# Patient Record
Sex: Female | Born: 1975 | Race: White | Hispanic: No | Marital: Single | Smoking: Never smoker
Health system: Southern US, Community
[De-identification: ages and names within clinical notes are randomized; demographics above are authoritative.]

## PROBLEM LIST (undated history)

## (undated) DIAGNOSIS — Z8481 Family history of carrier of genetic disease: Secondary | ICD-10-CM

## (undated) DIAGNOSIS — Z803 Family history of malignant neoplasm of breast: Secondary | ICD-10-CM

## (undated) DIAGNOSIS — Z8042 Family history of malignant neoplasm of prostate: Secondary | ICD-10-CM

## (undated) HISTORY — DX: Family history of carrier of genetic disease: Z84.81

## (undated) HISTORY — PX: BREAST CYST EXCISION: SHX579

## (undated) HISTORY — DX: Family history of malignant neoplasm of prostate: Z80.42

## (undated) HISTORY — DX: Family history of malignant neoplasm of breast: Z80.3

---

## 1998-12-07 ENCOUNTER — Encounter: Admission: RE | Admit: 1998-12-07 | Discharge: 1998-12-07 | Payer: Self-pay | Admitting: Family Medicine

## 1998-12-07 ENCOUNTER — Encounter: Payer: Self-pay | Admitting: Family Medicine

## 2004-01-02 ENCOUNTER — Ambulatory Visit: Payer: Self-pay | Admitting: Family Medicine

## 2004-06-07 ENCOUNTER — Ambulatory Visit: Payer: Self-pay | Admitting: Family Medicine

## 2004-09-20 ENCOUNTER — Ambulatory Visit: Payer: Self-pay | Admitting: Family Medicine

## 2004-10-08 ENCOUNTER — Ambulatory Visit: Payer: Self-pay | Admitting: Internal Medicine

## 2004-10-10 ENCOUNTER — Encounter: Admission: RE | Admit: 2004-10-10 | Discharge: 2005-01-08 | Payer: Self-pay | Admitting: Family Medicine

## 2005-03-24 ENCOUNTER — Ambulatory Visit: Payer: Self-pay | Admitting: Family Medicine

## 2005-04-01 ENCOUNTER — Other Ambulatory Visit: Admission: RE | Admit: 2005-04-01 | Discharge: 2005-04-01 | Payer: Self-pay | Admitting: Obstetrics & Gynecology

## 2006-02-20 ENCOUNTER — Ambulatory Visit: Payer: Self-pay | Admitting: Family Medicine

## 2006-06-19 ENCOUNTER — Other Ambulatory Visit: Admission: RE | Admit: 2006-06-19 | Discharge: 2006-06-19 | Payer: Self-pay | Admitting: Obstetrics and Gynecology

## 2006-07-08 ENCOUNTER — Ambulatory Visit: Payer: Self-pay | Admitting: Internal Medicine

## 2006-07-10 DIAGNOSIS — S83509A Sprain of unspecified cruciate ligament of unspecified knee, initial encounter: Secondary | ICD-10-CM | POA: Insufficient documentation

## 2006-07-10 DIAGNOSIS — Z9189 Other specified personal risk factors, not elsewhere classified: Secondary | ICD-10-CM | POA: Insufficient documentation

## 2006-07-20 ENCOUNTER — Ambulatory Visit: Payer: Self-pay | Admitting: Family Medicine

## 2006-07-20 DIAGNOSIS — B370 Candidal stomatitis: Secondary | ICD-10-CM | POA: Insufficient documentation

## 2006-07-29 ENCOUNTER — Telehealth (INDEPENDENT_AMBULATORY_CARE_PROVIDER_SITE_OTHER): Payer: Self-pay | Admitting: *Deleted

## 2006-08-05 ENCOUNTER — Ambulatory Visit: Payer: Self-pay | Admitting: Family Medicine

## 2006-08-06 ENCOUNTER — Encounter: Payer: Self-pay | Admitting: Family Medicine

## 2006-08-09 LAB — CONVERTED CEMR LAB

## 2006-08-10 ENCOUNTER — Telehealth (INDEPENDENT_AMBULATORY_CARE_PROVIDER_SITE_OTHER): Payer: Self-pay | Admitting: *Deleted

## 2006-08-10 LAB — CONVERTED CEMR LAB
BUN: 10 mg/dL (ref 6–23)
Basophils Absolute: 0 10*3/uL (ref 0.0–0.1)
Basophils Relative: 0 % (ref 0.0–1.0)
CO2: 30 meq/L (ref 19–32)
Calcium: 9.4 mg/dL (ref 8.4–10.5)
Chloride: 106 meq/L (ref 96–112)
Creatinine, Ser: 0.7 mg/dL (ref 0.4–1.2)
Eosinophils Absolute: 0 10*3/uL (ref 0.0–0.6)
Eosinophils Relative: 0.9 % (ref 0.0–5.0)
GFR calc Af Amer: 126 mL/min
GFR calc non Af Amer: 104 mL/min
Glucose, Bld: 93 mg/dL (ref 70–99)
HCT: 40 % (ref 36.0–46.0)
Hemoglobin: 13.8 g/dL (ref 12.0–15.0)
Lymphocytes Relative: 39.1 % (ref 12.0–46.0)
MCHC: 34.5 g/dL (ref 30.0–36.0)
MCV: 96.5 fL (ref 78.0–100.0)
Monocytes Absolute: 0.4 10*3/uL (ref 0.2–0.7)
Monocytes Relative: 7 % (ref 3.0–11.0)
Neutro Abs: 2.9 10*3/uL (ref 1.4–7.7)
Neutrophils Relative %: 53 % (ref 43.0–77.0)
Platelets: 179 10*3/uL (ref 150–400)
Potassium: 4.3 meq/L (ref 3.5–5.1)
RBC: 4.14 M/uL (ref 3.87–5.11)
RDW: 12 % (ref 11.5–14.6)
Sodium: 140 meq/L (ref 135–145)
WBC: 5.5 10*3/uL (ref 4.5–10.5)

## 2006-08-18 ENCOUNTER — Encounter: Payer: Self-pay | Admitting: Family Medicine

## 2007-01-19 ENCOUNTER — Ambulatory Visit: Payer: Self-pay | Admitting: Family Medicine

## 2007-01-22 ENCOUNTER — Ambulatory Visit: Payer: Self-pay | Admitting: Family Medicine

## 2007-11-05 ENCOUNTER — Other Ambulatory Visit: Admission: RE | Admit: 2007-11-05 | Discharge: 2007-11-05 | Payer: Self-pay | Admitting: Obstetrics and Gynecology

## 2008-01-20 ENCOUNTER — Ambulatory Visit: Payer: Self-pay | Admitting: Internal Medicine

## 2008-01-20 DIAGNOSIS — K219 Gastro-esophageal reflux disease without esophagitis: Secondary | ICD-10-CM | POA: Insufficient documentation

## 2008-01-20 DIAGNOSIS — L259 Unspecified contact dermatitis, unspecified cause: Secondary | ICD-10-CM | POA: Insufficient documentation

## 2008-01-20 DIAGNOSIS — R030 Elevated blood-pressure reading, without diagnosis of hypertension: Secondary | ICD-10-CM | POA: Insufficient documentation

## 2008-02-17 ENCOUNTER — Encounter: Payer: Self-pay | Admitting: Internal Medicine

## 2008-02-17 LAB — CONVERTED CEMR LAB
ALT: 17 units/L (ref 0–35)
AST: 20 units/L (ref 0–37)
Albumin: 4 g/dL (ref 3.5–5.2)
Alkaline Phosphatase: 50 units/L (ref 39–117)
BUN: 11 mg/dL (ref 6–23)
Basophils Absolute: 0 10*3/uL (ref 0.0–0.1)
Basophils Relative: 0.5 % (ref 0.0–3.0)
Bilirubin Urine: NEGATIVE
Bilirubin, Direct: 0.1 mg/dL (ref 0.0–0.3)
CO2: 28 meq/L (ref 19–32)
Calcium: 9.2 mg/dL (ref 8.4–10.5)
Chloride: 105 meq/L (ref 96–112)
Cholesterol: 175 mg/dL (ref 0–200)
Creatinine, Ser: 0.7 mg/dL (ref 0.4–1.2)
Crystals: NEGATIVE
Eosinophils Absolute: 0.1 10*3/uL (ref 0.0–0.7)
Eosinophils Relative: 1.2 % (ref 0.0–5.0)
GFR calc Af Amer: 125 mL/min
GFR calc non Af Amer: 103 mL/min
Glucose, Bld: 98 mg/dL (ref 70–99)
HCT: 42.1 % (ref 36.0–46.0)
HDL: 77.2 mg/dL (ref >39.0)
Hemoglobin: 14.6 g/dL (ref 12.0–15.0)
Ketones, ur: NEGATIVE mg/dL
LDL Cholesterol: 89 mg/dL (ref 0–99)
Lymphocytes Relative: 34.9 % (ref 12.0–46.0)
MCHC: 34.7 g/dL (ref 30.0–36.0)
MCV: 96.3 fL (ref 78.0–100.0)
Monocytes Absolute: 0.3 10*3/uL (ref 0.1–1.0)
Monocytes Relative: 7 % (ref 3.0–12.0)
Mucus, UA: NEGATIVE
Neutro Abs: 2.5 10*3/uL (ref 1.4–7.7)
Neutrophils Relative %: 56.4 % (ref 43.0–77.0)
Nitrite: NEGATIVE
Platelets: 189 10*3/uL (ref 150–400)
Potassium: 4.6 meq/L (ref 3.5–5.1)
RBC: 4.37 M/uL (ref 3.87–5.11)
RDW: 11.6 % (ref 11.5–14.6)
Sodium: 140 meq/L (ref 135–145)
Specific Gravity, Urine: 1.015 (ref 1.000–1.03)
Total Bilirubin: 0.7 mg/dL (ref 0.3–1.2)
Total CHOL/HDL Ratio: 2.3
Total Protein: 6.1 g/dL (ref 6.0–8.3)
Triglycerides: 42 mg/dL (ref 0–149)
Urine Glucose: NEGATIVE mg/dL
Urobilinogen, UA: 0.2 (ref 0.0–1.0)
VLDL: 8 mg/dL (ref 0–40)
WBC: 4.5 10*3/uL (ref 4.5–10.5)
pH: 7.5 (ref 5.0–8.0)

## 2008-03-23 ENCOUNTER — Ambulatory Visit: Payer: Self-pay | Admitting: Internal Medicine

## 2008-03-23 LAB — CONVERTED CEMR LAB
ALT: 17 units/L (ref 0–35)
AST: 20 units/L (ref 0–37)
Albumin: 4 g/dL (ref 3.5–5.2)
Alkaline Phosphatase: 50 units/L (ref 39–117)
BUN: 11 mg/dL (ref 6–23)
Basophils Absolute: 0 10*3/uL (ref 0.0–0.1)
Basophils Relative: 0.5 % (ref 0.0–3.0)
Bilirubin Urine: NEGATIVE
Bilirubin, Direct: 0.1 mg/dL (ref 0.0–0.3)
CO2: 28 meq/L (ref 19–32)
Calcium: 9.2 mg/dL (ref 8.4–10.5)
Chloride: 105 meq/L (ref 96–112)
Cholesterol: 175 mg/dL (ref 0–200)
Creatinine, Ser: 0.7 mg/dL (ref 0.4–1.2)
Crystals: NEGATIVE
Eosinophils Absolute: 0.1 10*3/uL (ref 0.0–0.7)
Eosinophils Relative: 1.2 % (ref 0.0–5.0)
GFR calc Af Amer: 125 mL/min
GFR calc non Af Amer: 103 mL/min
Glucose, Bld: 98 mg/dL (ref 70–99)
HCT: 42.1 % (ref 36.0–46.0)
HDL: 77.2 mg/dL (ref >39.0)
Hemoglobin: 14.6 g/dL (ref 12.0–15.0)
Ketones, ur: NEGATIVE mg/dL
LDL Cholesterol: 89 mg/dL (ref 0–99)
Lymphocytes Relative: 34.9 % (ref 12.0–46.0)
MCHC: 34.7 g/dL (ref 30.0–36.0)
MCV: 96.3 fL (ref 78.0–100.0)
Monocytes Absolute: 0.3 10*3/uL (ref 0.1–1.0)
Monocytes Relative: 7 % (ref 3.0–12.0)
Mucus, UA: NEGATIVE
Neutro Abs: 2.5 10*3/uL (ref 1.4–7.7)
Neutrophils Relative %: 56.4 % (ref 43.0–77.0)
Nitrite: NEGATIVE
Platelets: 189 10*3/uL (ref 150–400)
Potassium: 4.6 meq/L (ref 3.5–5.1)
RBC: 4.37 M/uL (ref 3.87–5.11)
RDW: 11.6 % (ref 11.5–14.6)
Sodium: 140 meq/L (ref 135–145)
Specific Gravity, Urine: 1.015 (ref 1.000–1.03)
Total Bilirubin: 0.7 mg/dL (ref 0.3–1.2)
Total CHOL/HDL Ratio: 2.3
Total Protein: 6.1 g/dL (ref 6.0–8.3)
Triglycerides: 42 mg/dL (ref 0–149)
Urine Glucose: NEGATIVE mg/dL
Urobilinogen, UA: 0.2 (ref 0.0–1.0)
VLDL: 8 mg/dL (ref 0–40)
WBC: 4.5 10*3/uL (ref 4.5–10.5)
pH: 7.5 (ref 5.0–8.0)

## 2008-03-28 ENCOUNTER — Encounter: Payer: Self-pay | Admitting: Internal Medicine

## 2009-09-07 ENCOUNTER — Encounter: Admission: RE | Admit: 2009-09-07 | Discharge: 2009-09-07 | Payer: Self-pay | Admitting: Specialist

## 2009-09-13 ENCOUNTER — Encounter: Admission: RE | Admit: 2009-09-13 | Discharge: 2009-09-13 | Payer: Self-pay | Admitting: Sports Medicine

## 2010-02-13 ENCOUNTER — Encounter
Admission: RE | Admit: 2010-02-13 | Discharge: 2010-02-13 | Payer: Self-pay | Source: Home / Self Care | Attending: Neurology | Admitting: Neurology

## 2011-04-03 ENCOUNTER — Ambulatory Visit: Payer: Self-pay

## 2011-04-03 ENCOUNTER — Ambulatory Visit: Payer: PRIVATE HEALTH INSURANCE | Admitting: Family Medicine

## 2011-04-03 DIAGNOSIS — R509 Fever, unspecified: Secondary | ICD-10-CM

## 2011-04-03 DIAGNOSIS — R05 Cough: Secondary | ICD-10-CM

## 2011-04-03 DIAGNOSIS — R059 Cough, unspecified: Secondary | ICD-10-CM

## 2011-04-03 LAB — POCT CBC
Lymph, poc: 1 (ref 0.6–3.4)
MCHC: 32.6 g/dL (ref 31.8–35.4)
MID (cbc): 0.4 (ref 0–0.9)
MPV: 10.3 fL (ref 0–99.8)
POC Granulocyte: 2.8 (ref 2–6.9)
POC LYMPH PERCENT: 24.6 %L (ref 10–50)
POC MID %: 9.3 %M (ref 0–12)
Platelet Count, POC: 155 10*3/uL (ref 142–424)
RDW, POC: 12.8 %

## 2011-04-03 NOTE — Patient Instructions (Signed)
Mucinex, saline nasal spray, otc decongestants as needed.  Return to the clinic or go to the nearest emergency room if any of your symptoms worsen or new symptoms occur.   Influenza, Adult Influenza ("the flu") is a viral infection of the respiratory tract. It causes chills, fever, cough, headache, body aches, and sore throat. Influenza in general will make you feel sicker than when you have a common cold. Symptoms of the illness typically last a few days. Cough and fatigue may continue for as long as 7 to 10 days. Influenza is highly contagious. It spreads easily to others in the droplets from coughs and sneezes. People frequently become infected by touching something that was recently contaminated with the virus and then touch their mouth, nose or eyes. This infection is caused by a virus. Symptoms will not be reduced or improved by taking an antibiotic. Antibiotics are medications that kill bacteria, not viruses. DIAGNOSIS  Diagnosis of influenza is often made based on the history and physical examination as well as the presence of influenza reports occurring in your community. Testing can be done if the diagnosis is not certain. TREATMENT  Since influenza is caused by a virus, antibiotics are not helpful. Your caregiver may prescribe antiviral medicines to shorten the illness and lessen the severity. Your caregiver may also recommend influenza vaccination and/or antiviral medicines for your family members in order to prevent the spread of influenza to them. HOME CARE INSTRUCTIONS  DO NOT GIVE ASPIRIN TO PERSONS WITH INFLUENZA WHO ARE UNDER 68 YEARS OF AGE. This could lead to brain and liver damage (Reye's syndrome). Read the label on over-the-counter medicines.   Stay home from work or school if at all possible until most of your symptoms are gone.   Only take over-the-counter or prescription medicines for pain, discomfort, or fever as directed by your caregiver.   Use a cool mist humidifier  to increase air moisture. This will make breathing easier.   Rest until your temperature is nearly normal: 98.6 F (37 C). This usually takes 3 to 4 days. Be sure you get plenty of rest.   Drink at least eight, eight-ounce glasses of fluids per day. Fluids include water, juice, broth, gelatin, or lemonade.   Cover your mouth and nose when coughing or sneezing and wash your hands often to prevent the spread of this virus to other persons.  PREVENTION  Annual influenza vaccination (flu shots) is the best way to avoid getting influenza. An annual flu shot is now routinely recommended for all adults in the [place] SEEK MEDICAL CARE IF:   You develop shortness of breath while resting.   You have a deep cough with production of mucous or chest pain.   You develop nausea (feeling sick to your stomach), vomiting, or diarrhea.  SEEK IMMEDIATE MEDICAL CARE IF:   You have difficulty breathing, become short of breath, or your skin or nails turn bluish.   You develop severe neck pain or stiffness.   You develop a severe headache, facial pain, or earache.   You have a fever.   You develop nausea or vomiting that cannot be controlled.  Document Released: 02/01/2000 Document Revised: 10/16/2010 Document Reviewed: 12/06/2008 [facility] Patient Information 2012 [place], [place].

## 2011-04-03 NOTE — Progress Notes (Signed)
  Subjective:    Patient ID: Angelica Martinez, female    DOB: 1975/03/10, 36 y.o.   MRN: [id number]  HPI  Angelica Martinez is a 36 y.o. female 4 nights ago - f/c and body aches - temp 101  Stayed out of work x 1 day - had been better, went to work yesterday,  Chest and nasal congestion last night and today.  Temp 100.1 again. Tx: Advil and Advil congestion relief, airborne.  No flu vaccine this year.  Review of Systems  Constitutional: Positive for fever and chills.  HENT: Positive for congestion and rhinorrhea.   Respiratory: Positive for cough. Negative for shortness of breath and wheezing.   Gastrointestinal: Negative for abdominal pain.  Genitourinary: Negative for difficulty urinating.  Musculoskeletal: Positive for arthralgias.  Skin: Negative for rash.       Objective:   Physical Exam  Constitutional: She is oriented to person, place, and time. She appears well-developed and well-nourished.  HENT:  Head: Normocephalic and atraumatic.  Right Ear: External ear normal.  Left Ear: External ear normal.  Nose: Nose normal.  Mouth/Throat: Oropharynx is clear and moist. No oropharyngeal exudate.  Eyes: Conjunctivae and EOM are normal. Pupils are equal, round, and reactive to light.  Neck: Normal range of motion.  Cardiovascular: Normal rate, regular rhythm, normal heart sounds and intact distal pulses.   Pulmonary/Chest: Effort normal and breath sounds normal. No respiratory distress. She has no wheezes. She has no rales.  Neurological: She is alert and oriented to person, place, and time.  Skin: No rash noted.  Psychiatric: She has a normal mood and affect.   Results for orders placed in visit on 04/03/11  POCT INFLUENZA A/B      Component Value Range   Influenza A, POC Positive     Influenza B, POC      POCT CBC      Component Value Range   WBC 4.2 (*) 4.6 - 10.2 (K/uL)   Lymph, poc 1.0  0.6 - 3.4    POC LYMPH PERCENT 24.6  10 - 50 (%L)   MID (cbc) 0.4  0 - 0.9    POC MID % 9.3  0 - 12 (%M)   POC Granulocyte 2.8  2 - 6.9    Granulocyte percent 66.1  37 - 80 (%G)   RBC 4.75  4.04 - 5.48 (M/uL)   Hemoglobin 15.0  12.2 - 16.2 (g/dL)   HCT, POC 33.2  95.1 - 47.9 (%)   MCV 96.8  80 - 97 (fL)   MCH, POC 31.6 (*) 27 - 31.2 (pg)   MCHC 32.6  31.8 - 35.4 (g/dL)   RDW, POC 88.4     Platelet Count, POC 155  142 - 424 (K/uL)   MPV 10.3  0 - 99.8 (fL)         Assessment & Plan:   1. Influenza    2. Fever  POCT Influenza A/B, POCT CBC  3. Cough  POCT Influenza A/B   Influenza sx's now 3 days into sx's.   Discussed Tamiflu and timing, so deferred at this point.   Sx. Care  -- advil, tylenol, mucinex, lozenges and rtc precautions.

## 2017-04-15 DIAGNOSIS — J209 Acute bronchitis, unspecified: Secondary | ICD-10-CM | POA: Diagnosis not present

## 2017-04-16 ENCOUNTER — Ambulatory Visit (INDEPENDENT_AMBULATORY_CARE_PROVIDER_SITE_OTHER): Payer: BLUE CROSS/BLUE SHIELD | Admitting: Family Medicine

## 2017-04-16 ENCOUNTER — Encounter: Payer: Self-pay | Admitting: Family Medicine

## 2017-04-16 ENCOUNTER — Other Ambulatory Visit: Payer: Self-pay

## 2017-04-16 VITALS — BP 122/70 | HR 90 | Temp 97.7°F | Resp 16 | Ht 62.8 in | Wt 165.4 lb

## 2017-04-16 DIAGNOSIS — R05 Cough: Secondary | ICD-10-CM | POA: Diagnosis not present

## 2017-04-16 DIAGNOSIS — J069 Acute upper respiratory infection, unspecified: Secondary | ICD-10-CM | POA: Diagnosis not present

## 2017-04-16 DIAGNOSIS — J040 Acute laryngitis: Secondary | ICD-10-CM | POA: Diagnosis not present

## 2017-04-16 DIAGNOSIS — R059 Cough, unspecified: Secondary | ICD-10-CM

## 2017-04-16 MED ORDER — BENZONATATE 100 MG PO CAPS: 100.0000 mg | ORAL_CAPSULE | Freq: Three times a day (TID) | ORAL | 0 refills | Status: DC | PRN

## 2017-04-16 MED ORDER — HYDROCODONE-HOMATROPINE 5-1.5 MG/5ML PO SYRP: ORAL_SOLUTION | ORAL | 0 refills | Status: DC

## 2017-04-16 MED ORDER — AZITHROMYCIN 250 MG PO TABS: ORAL_TABLET | ORAL | 0 refills | Status: DC

## 2017-04-16 MED ORDER — FLUCONAZOLE 150 MG PO TABS: 150.0000 mg | ORAL_TABLET | Freq: Once | ORAL | 1 refills | Status: AC

## 2017-04-16 NOTE — Progress Notes (Signed)
Subjective:  By signing my name below, I, Angelica Martinez, attest that this documentation has been prepared under the direction and in the presence of Angelica Seat Asencion Partridge, MD.  Electronically Signed: Rosana Martinez, Medical Scribe 04/16/17 at 12:32 PM   Patient ID: Angelica Martinez, female    DOB: [birth year], 42 y.o.   MRN: [id number] Chief Complaint  Patient presents with  . Establish Care  . Nasal Congestion    with cough x 1 week     HPI Angelica Martinez is a 42 y.o. female who presents to Primary Care at [facility] to establish care. She has a Hx of GERD, eczema, and borderline high blood pressure previously. I last saw her in 2013 for influenza. She is here today with acute illness with nasal congestion and cough for 1 week.   She notes that she developed a weak sounding voice 1 week ago that worsened with symptoms of nasal congestion and dry cough. She tried Dayquil and Nyquil and her symptoms improved for 1 day and then 3 days ago her symptoms were much exacerbated. Her cough became worse, she notes it is not a deep cough though. She endorses contact with flu-like symptoms; she did have a flu shot this year. She denies antibiotic use but notes that she usually develops a yeast infection when she takes antibiotics. She denies fever, generalized myalgia, wheezing, SOB.     Patient Active Problem List   Diagnosis Date Noted  . GERD 01/20/2008  . ECZEMA 01/20/2008  . ELEVATED BLOOD PRESSURE WITHOUT DIAGNOSIS OF HYPERTENSION 01/20/2008  . CANDIDIASIS, MOUTH (THRUSH) 07/20/2006  . ACL TEAR 07/10/2006  . REDUCTION MAMMOPLASTY, HX OF 07/10/2006   History reviewed. No pertinent past medical history. History reviewed. No pertinent surgical history. Allergies  Allergen Reactions  . Tylenol [Acetaminophen] Palpitations  . Metronidazole    Prior to Admission medications   Medication Sig Start Date End Date Taking? Authorizing Provider  ibuprofen (ADVIL,MOTRIN) 100 MG chewable tablet Chew  400 mg by mouth every 8 (eight) hours as needed.   Yes [provider]   Social History   Socioeconomic History  . Marital status: Single    Spouse name: Not on file  . Number of children: Not on file  . Years of education: Not on file  . Highest education level: Not on file  Social Needs  . Financial resource strain: Not on file  . Food insecurity - worry: Not on file  . Food insecurity - inability: Not on file  . Transportation needs - medical: Not on file  . Transportation needs - non-medical: Not on file  Occupational History  . Not on file  Tobacco Use  . Smoking status: Never Smoker  . Smokeless tobacco: Never Used  Substance and Sexual Activity  . Alcohol use: Not on file  . Drug use: Not on file  . Sexual activity: Not on file  Other Topics Concern  . Not on file  Social History Narrative  . Not on file   Review of Systems  Constitutional: Negative for fever.       (+) hoarse voice  HENT: Positive for congestion (nasal).   Respiratory: Positive for cough (dry). Negative for shortness of breath and wheezing.   Musculoskeletal: Negative for myalgias.       Objective:   Physical Exam  Constitutional: She is oriented to person, place, and time. She appears well-developed and well-nourished. No distress.  HENT:  Head: Normocephalic and atraumatic.  Right Ear: Hearing, tympanic membrane,  external ear and ear canal normal.  Left Ear: Hearing, tympanic membrane, external ear and ear canal normal.  Nose: Nose normal. Right sinus exhibits no maxillary sinus tenderness and no frontal sinus tenderness. Left sinus exhibits no maxillary sinus tenderness and no frontal sinus tenderness.  Mouth/Throat: Oropharynx is clear and moist and mucous membranes are normal. No oropharyngeal exudate.  Minimal edema on turbinates  Eyes: Conjunctivae and EOM are normal. Pupils are equal, round, and reactive to light.  Cardiovascular: Normal rate, regular rhythm, normal heart  sounds and intact distal pulses.  No murmur heard. Pulmonary/Chest: Effort normal and breath sounds normal. No stridor. No respiratory distress. She has no wheezes. She has no rhonchi.  Neurological: She is alert and oriented to person, place, and time.  Skin: Skin is warm and dry. No rash noted.  Psychiatric: She has a normal mood and affect. Her behavior is normal.  Vitals reviewed.   Vitals:   04/16/17 1159  BP: 122/70  Pulse: 90  Resp: 16  Temp: 97.7 F (36.5 C)  TempSrc: Oral  SpO2: 99%  Weight: 165 lb 6.4 oz (75 kg)  Height: 5' 2.8" (1.595 m)      Assessment & Plan:    [name] is a 42 y.o. female Acute upper respiratory infection  Cough - Plan: benzonatate (TESSALON) 100 MG capsule, HYDROcodone-homatropine (HYCODAN) 5-1.5 MG/5ML syrup, azithromycin (ZITHROMAX) 250 MG tablet  Laryngitis  Initially suspected viral symptoms with possible secondary worsening recently versus worsening symptoms coming off of medication. Reassuring exam, reassuring vital signs.   -Continued symptomatic care discussed with Tessalon Perles, saline nasal spray if needed, fluids, relative rest, hydrocodone as cough syrup at night if needed or NyQuil. Do not combine.  -If not improving through the weekend, antibiotic Z-Pak provided for coverage of lower respiratory tract infection, unlikely needed at this time. Potential side effects discussed, history of candidal vaginitis with antibodies - Diflucan prescription provided if needed  -RTC precautions  Meds ordered this encounter  Medications  . benzonatate (TESSALON) 100 MG capsule    Sig: Take 1 capsule (100 mg total) by mouth 3 (three) times daily as needed for cough.    Dispense:  20 capsule    Refill:  0  . HYDROcodone-homatropine (HYCODAN) 5-1.5 MG/5ML syrup    Sig: 70m by mouth a bedtime as needed for cough.    Dispense:  120 mL    Refill:  0  . azithromycin (ZITHROMAX) 250 MG tablet    Sig: Take 2 pills by mouth on day 1, then  1 pill by mouth per day on days 2 through 5.    Dispense:  6 tablet    Refill:  0  . fluconazole (DIFLUCAN) 150 MG tablet    Sig: Take 1 tablet (150 mg total) by mouth once for 1 dose. Repeat in 1 week if needed.    Dispense:  1 tablet    Refill:  1   Patient Instructions   Current symptoms may still be due to a virus. Try Tessalon Perles 3 times per day as needed for cough, hydrocodone cough syrup at bedtime if needed. Do not combine hydrocodone syrup and NyQuil. Make sure you stay hydrated, voice rest as much is possible for laryngitis symptoms, and can try over-the-counter saline nasal spray for nasal congestion. If you are not starting to improve through the weekend, can fill antibiotic as discussed, then Diflucan if needed.  Let me know if there are questions. Follow-up at some point for physical and  we can check to see if there are other screening tests needed.   Laryngitis Laryngitis is inflammation of your vocal cords. This causes hoarseness, coughing, loss of voice, sore throat, or a dry throat. Your vocal cords are two bands of muscles that are found in your throat. When you speak, these cords come together and vibrate. These vibrations come out through your mouth as sound. When your vocal cords are inflamed, your voice sounds different. Laryngitis can be temporary (acute) or long-term (chronic). Most cases of acute laryngitis improve with time. Chronic laryngitis is laryngitis that lasts for more than three weeks. What are the causes? Acute laryngitis may be caused by:  A viral infection.  Lots of talking, yelling, or singing. This is also called vocal strain.  Bacterial infections.  Chronic laryngitis may be caused by:  Vocal strain.  Injury to your vocal cords.  Acid reflux (gastroesophageal reflux disease or GERD).  Allergies.  Sinus infection.  Smoking.  Alcohol abuse.  Breathing in chemicals or dust.  Growths on the vocal cords.  What increases the  risk? Risk factors for laryngitis include:  Smoking.  Alcohol abuse.  Having allergies.  What are the signs or symptoms? Symptoms of laryngitis may include:  Low, hoarse voice.  Loss of voice.  Dry cough.  Sore throat.  Stuffy nose.  How is this diagnosed? Laryngitis may be diagnosed by:  Physical exam.  Throat culture.  Blood test.  Laryngoscopy. This procedure allows your health care provider to look at your vocal cords with a mirror or viewing tube.  How is this treated? Treatment for laryngitis depends on what is causing it. Usually, treatment involves resting your voice and using medicines to soothe your throat. However, if your laryngitis is caused by a bacterial infection, you may need to take antibiotic medicine. If your laryngitis is caused by a growth, you may need to have a procedure to remove it. Follow these instructions at home:  Drink enough fluid to keep your urine clear or pale yellow.  Breathe in moist air. Use a humidifier if you live in a dry climate.  Take medicines only as directed by your health care provider.  If you were prescribed an antibiotic medicine, finish it all even if you start to feel better.  Do not smoke cigarettes or electronic cigarettes. If you need help quitting, ask your health care provider.  Talk as little as possible. Also avoid whispering, which can cause vocal strain.  Write instead of talking. Do this until your voice is back to normal. Contact a health care provider if:  You have a fever.  You have increasing pain.  You have difficulty swallowing. Get help right away if:  You cough up blood.  You have trouble breathing. This information is not intended to replace advice given to you by your health care provider. Make sure you discuss any questions you have with your health care provider. Document Released: 02/03/2005 Document Revised: 07/12/2015 Document Reviewed: 07/19/2013 Elsevier Interactive Patient  Education  2018 [employer]  Upper Respiratory Infection, Adult Most upper respiratory infections (URIs) are caused by a virus. A URI affects the nose, throat, and upper air passages. The most common type of URI is often called "the common cold." Follow these instructions at home:  Take medicines only as told by your doctor.  Gargle warm saltwater or take cough drops to comfort your throat as told by your doctor.  Use a warm mist humidifier or inhale steam from a shower  to increase air moisture. This may make it easier to breathe.  Drink enough fluid to keep your pee (urine) clear or pale yellow.  Eat soups and other clear broths.  Have a healthy diet.  Rest as needed.  Go back to work when your fever is gone or your doctor says it is okay. ? You may need to stay home longer to avoid giving your URI to others. ? You can also wear a face mask and wash your hands often to prevent spread of the virus.  Use your inhaler more if you have asthma.  Do not use any tobacco products, including cigarettes, chewing tobacco, or electronic cigarettes. If you need help quitting, ask your doctor. Contact a doctor if:  You are getting worse, not better.  Your symptoms are not helped by medicine.  You have chills.  You are getting more short of breath.  You have brown or red mucus.  You have yellow or brown discharge from your nose.  You have pain in your face, especially when you bend forward.  You have a fever.  You have puffy (swollen) neck glands.  You have pain while swallowing.  You have white areas in the back of your throat. Get help right away if:  You have very bad or constant: ? Headache. ? Ear pain. ? Pain in your forehead, behind your eyes, and over your cheekbones (sinus pain). ? Chest pain.  You have long-lasting (chronic) lung disease and any of the following: ? Wheezing. ? Long-lasting cough. ? Coughing up blood. ? A change in your usual mucus.  You  have a stiff neck.  You have changes in your: ? Vision. ? Hearing. ? Thinking. ? Mood. This information is not intended to replace advice given to you by your health care provider. Make sure you discuss any questions you have with your health care provider. Document Released: 07/23/2007 Document Revised: 10/07/2015 Document Reviewed: 05/11/2013 Elsevier Interactive Patient Education  2018 [employer]    IF you received an x-ray today, you will receive an invoice from [facility]. Please contact [facility] at [phone] with questions or concerns regarding your invoice.   IF you received labwork today, you will receive an invoice from [facility]. Please contact [facility] at [phone] with questions or concerns regarding your invoice.   Our billing staff will not be able to assist you with questions regarding bills from these companies.  You will be contacted with the lab results as soon as they are available. The fastest way to get your results is to activate your My Chart account. Instructions are located on the last page of this paperwork. If you have not heard from us regarding the results in 2 weeks, please contact this office.       I personally performed the services described in this documentation, which was scribed in my presence. The recorded information has been reviewed and considered for accuracy and completeness, addended by me as needed, and agree with information above.  Signed,   Meredith StaggersJeffrey Marlin Jarrard, MD [facility] at [facility].  04/16/17 12:51 PM

## 2017-04-16 NOTE — Patient Instructions (Addendum)
Current symptoms may still be due to a virus. Try Tessalon Perles 3 times per day as needed for cough, hydrocodone cough syrup at bedtime if needed. Do not combine hydrocodone syrup and NyQuil. Make sure you stay hydrated, voice rest as much is possible for laryngitis symptoms, and can try over-the-counter saline nasal spray for nasal congestion. If you are not starting to improve through the weekend, can fill antibiotic as discussed, then Diflucan if needed.  Let me know if there are questions. Follow-up at some point for physical and we can check to see if there are other screening tests needed.   Laryngitis Laryngitis is inflammation of your vocal cords. This causes hoarseness, coughing, loss of voice, sore throat, or a dry throat. Your vocal cords are two bands of muscles that are found in your throat. When you speak, these cords come together and vibrate. These vibrations come out through your mouth as sound. When your vocal cords are inflamed, your voice sounds different. Laryngitis can be temporary (acute) or long-term (chronic). Most cases of acute laryngitis improve with time. Chronic laryngitis is laryngitis that lasts for more than three weeks. What are the causes? Acute laryngitis may be caused by:  A viral infection.  Lots of talking, yelling, or singing. This is also called vocal strain.  Bacterial infections.  Chronic laryngitis may be caused by:  Vocal strain.  Injury to your vocal cords.  Acid reflux (gastroesophageal reflux disease or GERD).  Allergies.  Sinus infection.  Smoking.  Alcohol abuse.  Breathing in chemicals or dust.  Growths on the vocal cords.  What increases the risk? Risk factors for laryngitis include:  Smoking.  Alcohol abuse.  Having allergies.  What are the signs or symptoms? Symptoms of laryngitis may include:  Low, hoarse voice.  Loss of voice.  Dry cough.  Sore throat.  Stuffy nose.  How is this diagnosed? Laryngitis  may be diagnosed by:  Physical exam.  Throat culture.  Blood test.  Laryngoscopy. This procedure allows your health care provider to look at your vocal cords with a mirror or viewing tube.  How is this treated? Treatment for laryngitis depends on what is causing it. Usually, treatment involves resting your voice and using medicines to soothe your throat. However, if your laryngitis is caused by a bacterial infection, you may need to take antibiotic medicine. If your laryngitis is caused by a growth, you may need to have a procedure to remove it. Follow these instructions at home:  Drink enough fluid to keep your urine clear or pale yellow.  Breathe in moist air. Use a humidifier if you live in a dry climate.  Take medicines only as directed by your health care provider.  If you were prescribed an antibiotic medicine, finish it all even if you start to feel better.  Do not smoke cigarettes or electronic cigarettes. If you need help quitting, ask your health care provider.  Talk as little as possible. Also avoid whispering, which can cause vocal strain.  Write instead of talking. Do this until your voice is back to normal. Contact a health care provider if:  You have a fever.  You have increasing pain.  You have difficulty swallowing. Get help right away if:  You cough up blood.  You have trouble breathing. This information is not intended to replace advice given to you by your health care provider. Make sure you discuss any questions you have with your health care provider. Document Released: 02/03/2005 Document Revised: 07/12/2015  Document Reviewed: 07/19/2013 Elsevier Interactive Patient Education  2018 Elsevier Inc.  Upper Respiratory Infection, Adult Most upper respiratory infections (URIs) are caused by a virus. A URI affects the nose, throat, and upper air passages. The most common type of URI is often called "the common cold." Follow these instructions at  home:  Take medicines only as told by your doctor.  Gargle warm saltwater or take cough drops to comfort your throat as told by your doctor.  Use a warm mist humidifier or inhale steam from a shower to increase air moisture. This may make it easier to breathe.  Drink enough fluid to keep your pee (urine) clear or pale yellow.  Eat soups and other clear broths.  Have a healthy diet.  Rest as needed.  Go back to work when your fever is gone or your doctor says it is okay. ? You may need to stay home longer to avoid giving your URI to others. ? You can also wear a face mask and wash your hands often to prevent spread of the virus.  Use your inhaler more if you have asthma.  Do not use any tobacco products, including cigarettes, chewing tobacco, or electronic cigarettes. If you need help quitting, ask your doctor. Contact a doctor if:  You are getting worse, not better.  Your symptoms are not helped by medicine.  You have chills.  You are getting more short of breath.  You have brown or red mucus.  You have yellow or brown discharge from your nose.  You have pain in your face, especially when you bend forward.  You have a fever.  You have puffy (swollen) neck glands.  You have pain while swallowing.  You have white areas in the back of your throat. Get help right away if:  You have very bad or constant: ? Headache. ? Ear pain. ? Pain in your forehead, behind your eyes, and over your cheekbones (sinus pain). ? Chest pain.  You have long-lasting (chronic) lung disease and any of the following: ? Wheezing. ? Long-lasting cough. ? Coughing up blood. ? A change in your usual mucus.  You have a stiff neck.  You have changes in your: ? Vision. ? Hearing. ? Thinking. ? Mood. This information is not intended to replace advice given to you by your health care provider. Make sure you discuss any questions you have with your health care provider. Document Released:  07/23/2007 Document Revised: 10/07/2015 Document Reviewed: 05/11/2013 Elsevier Interactive Patient Education  2018 [employer]    IF you received an x-ray today, you will receive an invoice from [facility]. Please contact [facility] at [phone] with questions or concerns regarding your invoice.   IF you received labwork today, you will receive an invoice from [facility]. Please contact [facility] at [phone] with questions or concerns regarding your invoice.   Our billing staff will not be able to assist you with questions regarding bills from these companies.  You will be contacted with the lab results as soon as they are available. The fastest way to get your results is to activate your My Chart account. Instructions are located on the last page of this paperwork. If you have not heard from us regarding the results in 2 weeks, please contact this office.

## 2017-09-16 DIAGNOSIS — H0289 Other specified disorders of eyelid: Secondary | ICD-10-CM | POA: Diagnosis not present

## 2018-11-15 ENCOUNTER — Telehealth: Payer: Self-pay | Admitting: Family Medicine

## 2018-11-15 NOTE — Telephone Encounter (Signed)
Please call and schedule appt for annual exam with Angelica Martinez. Thanks.

## 2018-11-15 NOTE — Telephone Encounter (Signed)
Called pt. lvmtcb and schedule annual cpe

## 2018-12-02 ENCOUNTER — Encounter: Payer: Self-pay | Admitting: Family Medicine

## 2018-12-02 ENCOUNTER — Other Ambulatory Visit: Payer: Self-pay

## 2018-12-02 ENCOUNTER — Ambulatory Visit (INDEPENDENT_AMBULATORY_CARE_PROVIDER_SITE_OTHER): Payer: BC Managed Care – PPO | Admitting: Family Medicine

## 2018-12-02 VITALS — BP 126/81 | HR 98 | Temp 98.3°F | Wt 164.6 lb

## 2018-12-02 DIAGNOSIS — Z803 Family history of malignant neoplasm of breast: Secondary | ICD-10-CM | POA: Diagnosis not present

## 2018-12-02 DIAGNOSIS — Z131 Encounter for screening for diabetes mellitus: Secondary | ICD-10-CM | POA: Diagnosis not present

## 2018-12-02 DIAGNOSIS — Z13220 Encounter for screening for lipoid disorders: Secondary | ICD-10-CM | POA: Diagnosis not present

## 2018-12-02 DIAGNOSIS — Z0000 Encounter for general adult medical examination without abnormal findings: Secondary | ICD-10-CM

## 2018-12-02 DIAGNOSIS — Z1231 Encounter for screening mammogram for malignant neoplasm of breast: Secondary | ICD-10-CM

## 2018-12-02 DIAGNOSIS — Z23 Encounter for immunization: Secondary | ICD-10-CM

## 2018-12-02 DIAGNOSIS — Z130 Encounter for screening for diseases of the blood and blood-forming organs and certain disorders involving the immune mechanism: Secondary | ICD-10-CM | POA: Diagnosis not present

## 2018-12-02 DIAGNOSIS — Z1329 Encounter for screening for other suspected endocrine disorder: Secondary | ICD-10-CM

## 2018-12-02 DIAGNOSIS — Z124 Encounter for screening for malignant neoplasm of cervix: Secondary | ICD-10-CM

## 2018-12-02 NOTE — Patient Instructions (Addendum)
Let me know if geneticist has any recommendations for screening for you.  I did refer you for mammogram as well as referral to Dr. Garwin Brothers for Pap testing.  Follow-up with Dr. Ubaldo Glassing for the area on the right ankle, but that can happen in next few months. I will let you know the results of your blood work within the next 2 weeks.  If you sign up for my chart, that is the quickest way for results.   Please let me know if there are questions in the meantime.  Keeping You Healthy  Get These Tests 1. Blood Pressure- Have your blood pressure checked once a year by your health care provider.  Normal blood pressure is 120/80. 2. Weight- Have your body mass index (BMI) calculated to screen for obesity.  BMI is measure of body fat based on height and weight.  You can also calculate your own BMI at GravelBags.it. 3. Cholesterol- Have your cholesterol checked every 5 years starting at age 72 then yearly starting at age 5. 67. Chlamydia, HIV, and other sexually transmitted diseases- Get screened every year until age 56, then within three months of each new sexual provider. 5. Pap Test - Every 1-5 years; discuss with your health care provider. 6. Mammogram- Every 1-2 years starting at age 65--50  Take these medicines  Calcium with Vitamin D-Your body needs 1200 mg of Calcium each day and [phone] IU of Vitamin D daily.  Your body can only absorb 500 mg of Calcium at a time so Calcium must be taken in 2 or 3 divided doses throughout the day.  Multivitamin with folic acid- Once daily if it is possible for you to become pregnant.  Get these Immunizations  Gardasil-Series of three doses; prevents HPV related illness such as genital warts and cervical cancer.  Menactra-Single dose; prevents meningitis.  Tetanus shot- Every 10 years.  Flu shot-Every year.  Take these steps 1. Do not smoke-Your healthcare provider can help you quit.  For tips on how to quit go to www.smokefree.gov or call  1-800 QUITNOW. 2. Be physically active- Exercise 5 days a week for at least 30 minutes.  If you are not already physically active, start slow and gradually work up to 30 minutes of moderate physical activity.  Examples of moderate activity include walking briskly, dancing, swimming, bicycling, etc. 3. Breast Cancer- A self breast exam every month is important for early detection of breast cancer.  For more information and instruction on self breast exams, ask your healthcare provider or https://www.patel.info/. 4. Eat a healthy diet- Eat a variety of healthy foods such as fruits, vegetables, whole grains, low fat milk, low fat cheeses, yogurt, lean meats, poultry and fish, beans, nuts, tofu, etc.  For more information go to www. Thenutritionsource.org 5. Drink alcohol in moderation- Limit alcohol intake to one drink or less per day. Never drink and drive. 6. Depression- Your emotional health is as important as your physical health.  If you're feeling down or losing interest in things you normally enjoy please talk to your healthcare provider about being screened for depression. 7. Dental visit- Brush and floss your teeth twice daily; visit your dentist twice a year. 8. Eye doctor- Get an eye exam at least every 2 years. 9. Helmet use- Always wear a helmet when riding a bicycle, motorcycle, rollerblading or skateboarding. 86. Safe sex- If you may be exposed to sexually transmitted infections, use a condom. 11. Seat belts- Seat belts can save your live; always  wear one. 12. Smoke/Carbon Monoxide detectors- These detectors need to be installed on the appropriate level of your home. Replace batteries at least once a year. 13. Skin cancer- When out in the sun please cover up and use sunscreen 15 SPF or higher. 14. Violence- If anyone is threatening or hurting you, please tell your healthcare provider.          If you have lab work done today you will be contacted with your lab  results within the next 2 weeks.  If you have not heard from [place] then please contact us. The fastest way to get your results is to register for My Chart.   IF you received an x-ray today, you will receive an invoice from [facility]. Please contact [facility] at [phone] with questions or concerns regarding your invoice.   IF you received labwork today, you will receive an invoice from [facility]. Please contact [facility] at [phone] with questions or concerns regarding your invoice.   Our billing staff will not be able to assist you with questions regarding bills from these companies.  You will be contacted with the lab results as soon as they are available. The fastest way to get your results is to activate your My Chart account. Instructions are located on the last page of this paperwork. If you have not heard from [place] regarding the results in 2 weeks, please contact this office.

## 2018-12-02 NOTE — Progress Notes (Signed)
Subjective:    Patient ID: Angelica Martinez, female    DOB: 12-15-1975, 43 y.o.   MRN: [id number]  HPI Angelica Martinez is a 43 y.o. female Presents today for: Chief Complaint  Patient presents with  . Annual Exam    here for annual and would like to get a referral to [facility] and a mammogram   Presents for annual physical exam.  History of elevated blood pressure without hypertension, history of GERD, eczema.  No prescription medication.   Cancer screening: Breast: Has not had mammogram.  Recently found out 25yo sister was diagnosed with breast cancer in early August. Had double mastectomy - possible BRCA+. Sister will see geneticist next month. Has 18 yo sister with normal mammogram recently.  Cervical: last pap about 13yr ago - [name] Would like to meet with cousins.  Skin/Derm: hx of eczema. No FH of skin cancer. [name] mole of face. Small bump in inner R ankle bigger past few years.   Immunization History  Administered Date(s) Administered  . Td 01/22/2007  [facility]  Tetanus: today.   [facility] 12/02/2018 04/16/2017  Decreased Interest 0 0  Down, Depressed, Hopeless 0 0  PHQ - 2 Score 0 0    Hearing Screening   '125Hz'  '250Hz'  '500Hz'  '1000Hz'  '2000Hz'  '3000Hz'  '4000Hz'  '6000Hz'  '8000Hz'   Right ear:           Left ear:             Visual Acuity Screening   Right eye Left eye Both eyes  Without correction: '20/15 20/20 20/15 '  With correction:     appt with [facility] -  Glasses.   Dental: [facility], every 6 months ago.   Exercise: converted garage to home gym. 4-5 days per week. Running mile, strength, 30-45 min.   Lipid screening:  No recent testing.  Lab Results  Component Value Date   CHOL 175 03/23/2008   HDL 77.2 03/23/2008   LDLCALC 89 03/23/2008   TRIG 42 03/23/2008   CHOLHDL 2.3 CALC 03/23/2008   LMP: 3 weeks ago. Declines contraception. Denies need. Normal menses, but slightly shorter. Unknown timing of  maternal menopause.     Patient Active Problem List   Diagnosis Date Noted  . GERD 01/20/2008  . ECZEMA 01/20/2008  . ELEVATED BLOOD PRESSURE WITHOUT DIAGNOSIS OF HYPERTENSION 01/20/2008  . CANDIDIASIS, MOUTH (THRUSH) 07/20/2006  . ACL TEAR 07/10/2006  . REDUCTION MAMMOPLASTY, HX OF 07/10/2006   No past medical history on file. No past surgical history on file. Allergies  Allergen Reactions  . Tylenol [Acetaminophen] Palpitations  . Metronidazole    Prior to Admission medications   Medication Sig Start Date End Date Taking? Authorizing Provider  ibuprofen (ADVIL,MOTRIN) 100 MG chewable tablet Chew 400 mg by mouth every 8 (eight) hours as needed.   Yes [provider]   Social History   Socioeconomic History  . Marital status: Single    Spouse name: Not on file  . Number of children: Not on file  . Years of education: Not on file  . Highest education level: Not on file  Occupational History  . Not on file  Social Needs  . Financial resource strain: Not on file  . Food insecurity    Worry: Not on file    Inability: Not on file  . Transportation needs    Medical: Not on file    Non-medical: Not on file  Tobacco Use  .  Smoking status: Never Smoker  . Smokeless tobacco: Never Used  Substance and Sexual Activity  . Alcohol use: Yes    Alcohol/week: 2.0 standard drinks    Types: 2 Glasses of wine per week  . Drug use: Never  . Sexual activity: Yes  Lifestyle  . Physical activity    Days per week: Not on file    Minutes per session: Not on file  . Stress: Not on file  Relationships  . Social Herbalist on phone: Not on file    Gets together: Not on file    Attends religious service: Not on file    Active member of club or organization: Not on file    Attends meetings of clubs or organizations: Not on file    Relationship status: Not on file  . Intimate partner violence    Fear of current or ex partner: Not on file    Emotionally abused: Not  on file    Physically abused: Not on file    Forced sexual activity: Not on file  Other Topics Concern  . Not on file  Social History Narrative  . Not on file    Review of Systems 13 point review of systems per patient health survey noted.  Negative other than as indicated above or in HPI.      Objective:   Physical Exam Exam conducted with a chaperone present.  Constitutional:      Appearance: She is well-developed.  HENT:     Head: Normocephalic and atraumatic.     Right Ear: External ear normal.     Left Ear: External ear normal.  Eyes:     Conjunctiva/sclera: Conjunctivae normal.     Pupils: Pupils are equal, round, and reactive to light.  Neck:     Musculoskeletal: Normal range of motion and neck supple.     Thyroid: No thyromegaly.  Cardiovascular:     Rate and Rhythm: Normal rate and regular rhythm.     Heart sounds: Normal heart sounds. No murmur.  Pulmonary:     Effort: Pulmonary effort is normal. No respiratory distress.     Breath sounds: Normal breath sounds. No wheezing.  Chest:     Breasts:        Right: No mass, nipple discharge or skin change.        Left: No mass, nipple discharge or skin change.     Comments: Breast exam by Dr. Pamella Pert, assistant Lexine Baton present.  Abdominal:     General: Bowel sounds are normal.     Palpations: Abdomen is soft.     Tenderness: There is no abdominal tenderness.  Lymphadenopathy:     Cervical: No cervical adenopathy.     Upper Body:     Right upper body: No supraclavicular, axillary or pectoral adenopathy.     Left upper body: No supraclavicular, axillary or pectoral adenopathy.  Skin:    General: Skin is warm and dry.     Findings: No rash.       Neurological:     Mental Status: She is alert and oriented to person, place, and time.  Psychiatric:        Behavior: Behavior normal.        Thought Content: Thought content normal.    Vitals:   12/02/18 1336  BP: 126/81  Pulse: 98  Temp: 98.3 F (36.8 C)   TempSrc: Oral  SpO2: 100%  Weight: 164 lb 9.6 oz (74.7 kg)  Assessment & Plan:  [name] is a 43 y.o. female Annual physical exam  - -anticipatory guidance as below in AVS, screening labs above. Health maintenance items as above in HPI discussed/recommended as applicable.   - follow up with dermatology to eval lesion on ankle.   Family history of breast cancer in sister Encounter for screening mammogram for malignant neoplasm of breast - Plan: MM Digital Screening  - mammogram ordered. No concerns on breast exam.   - consider further testing depending on her sister's meeting with genetic counselor.   Screening, anemia, deficiency, iron - Plan: CBC  Screening for thyroid disorder - Plan: TSH  Screening for hyperlipidemia - Plan: Lipid Panel  Screening for diabetes mellitus - Plan: Comprehensive metabolic panel  Screening for cervical cancer - Plan: Ambulatory referral to Gynecology  Need for Tdap vaccination - Plan: Tdap vaccine greater than or equal to 7yo IM   No orders of the defined types were placed in this encounter.  Patient Instructions      Let me know if geneticist has any recommendations for screening for you.  I did refer you for mammogram as well as referral to Dr. Garwin Brothers for Pap testing.  Follow-up with Dr. Ubaldo Glassing for the area on the right ankle, but that can happen in next few months. I will let you know the results of your blood work within the next 2 weeks.  If you sign up for my chart, that is the quickest way for results.   Please let me know if there are questions in the meantime.  Keeping You Healthy  Get These Tests 1. Blood Pressure- Have your blood pressure checked once a year by your health care provider.  Normal blood pressure is 120/80. 2. Weight- Have your body mass index (BMI) calculated to screen for obesity.  BMI is measure of body fat based on height and weight.  You can also calculate your own BMI at [employer]velBags.it.  3. Cholesterol- Have your cholesterol checked every 5 years starting at age 44 then yearly starting at age 27. 19. Chlamydia, HIV, and other sexually transmitted diseases- Get screened every year until age 65, then within three months of each new sexual provider. 5. Pap Test - Every 1-5 years; discuss with your health care provider. 6. Mammogram- Every 1-2 years starting at age 51--50  Take these medicines  Calcium with Vitamin D-Your body needs 1200 mg of Calcium each day and [phone] IU of Vitamin D daily.  Your body can only absorb 500 mg of Calcium at a time so Calcium must be taken in 2 or 3 divided doses throughout the day.  Multivitamin with folic acid- Once daily if it is possible for you to become pregnant.  Get these Immunizations  Gardasil-Series of three doses; prevents HPV related illness such as genital warts and cervical cancer.  Menactra-Single dose; prevents meningitis.  Tetanus shot- Every 10 years.  Flu shot-Every year.  Take these steps 1. Do not smoke-Your healthcare provider can help you quit.  For tips on how to quit go to www.smokefree.gov or call 1-800 QUITNOW. 2. Be physically active- Exercise 5 days a week for at least 30 minutes.  If you are not already physically active, start slow and gradually work up to 30 minutes of moderate physical activity.  Examples of moderate activity include walking briskly, dancing, swimming, bicycling, etc. 3. Breast Cancer- A self breast exam every month is important for early detection of breast cancer.  For more information and instruction  on self breast exams, ask your healthcare provider or https://www.patel.info/. 4. Eat a healthy diet- Eat a variety of healthy foods such as fruits, vegetables, whole grains, low fat milk, low fat cheeses, yogurt, lean meats, poultry and fish, beans, nuts, tofu, etc.  For more information go to www. Thenutritionsource.org 5. Drink alcohol in moderation- Limit alcohol  intake to one drink or less per day. Never drink and drive. 6. Depression- Your emotional health is as important as your physical health.  If you're feeling down or losing interest in things you normally enjoy please talk to your healthcare provider about being screened for depression. 7. Dental visit- Brush and floss your teeth twice daily; visit your dentist twice a year. 8. Eye doctor- Get an eye exam at least every 2 years. 9. Helmet use- Always wear a helmet when riding a bicycle, motorcycle, rollerblading or skateboarding. 34. Safe sex- If you may be exposed to sexually transmitted infections, use a condom. 11. Seat belts- Seat belts can save your live; always wear one. 12. Smoke/Carbon Monoxide detectors- These detectors need to be installed on the appropriate level of your home. Replace batteries at least once a year. 13. Skin cancer- When out in the sun please cover up and use sunscreen 15 SPF or higher. 14. Violence- If anyone is threatening or hurting you, please tell your healthcare provider.          If you have lab work done today you will be contacted with your lab results within the next 2 weeks.  If you have not heard from [place] then please contact us. The fastest way to get your results is to register for My Chart.   IF you received an x-ray today, you will receive an invoice from [facility]. Please contact [facility] at [phone] with questions or concerns regarding your invoice.   IF you received labwork today, you will receive an invoice from [facility]. Please contact [facility] at [phone] with questions or concerns regarding your invoice.   Our billing staff will not be able to assist you with questions regarding bills from these companies.  You will be contacted with the lab results as soon as they are available. The fastest way to get your results is to activate your My Chart account. Instructions are located on the last page of this  paperwork. If you have not heard from [place] regarding the results in 2 weeks, please contact this office.       Signed,   Merri Ray, MD [facility].  12/02/18 10:18 PM

## 2018-12-03 LAB — LIPID PANEL
Chol/HDL Ratio: 2 ratio (ref 0.0–4.4)
Cholesterol, Total: 170 mg/dL (ref 100–199)
HDL: 86 mg/dL (ref >39)
LDL Chol Calc (NIH): 72 mg/dL (ref 0–99)
Triglycerides: 60 mg/dL (ref 0–149)
VLDL Cholesterol Cal: 12 mg/dL (ref 5–40)

## 2018-12-03 LAB — COMPREHENSIVE METABOLIC PANEL
ALT: 18 IU/L (ref 0–32)
AST: 22 IU/L (ref 0–40)
Albumin/Globulin Ratio: 2.6 — ABNORMAL HIGH (ref 1.2–2.2)
Albumin: 4.6 g/dL (ref 3.8–4.8)
Alkaline Phosphatase: 53 IU/L (ref 39–117)
BUN/Creatinine Ratio: 21 (ref 9–23)
BUN: 21 mg/dL (ref 6–24)
Bilirubin Total: 0.3 mg/dL (ref 0.0–1.2)
CO2: 22 mmol/L (ref 20–29)
Calcium: 9.4 mg/dL (ref 8.7–10.2)
Chloride: 100 mmol/L (ref 96–106)
Creatinine, Ser: 1.01 mg/dL — ABNORMAL HIGH (ref 0.57–1.00)
GFR calc Af Amer: 79 mL/min/{1.73_m2} (ref >59)
GFR calc non Af Amer: 69 mL/min/{1.73_m2} (ref >59)
Globulin, Total: 1.8 g/dL (ref 1.5–4.5)
Glucose: 87 mg/dL (ref 65–99)
Potassium: 4.7 mmol/L (ref 3.5–5.2)
Sodium: 137 mmol/L (ref 134–144)
Total Protein: 6.4 g/dL (ref 6.0–8.5)

## 2018-12-03 LAB — CBC
Hematocrit: 41.9 % (ref 34.0–46.6)
Hemoglobin: 14.4 g/dL (ref 11.1–15.9)
MCH: 33 pg (ref 26.6–33.0)
MCHC: 34.4 g/dL (ref 31.5–35.7)
MCV: 96 fL (ref 79–97)
Platelets: 198 10*3/uL (ref 150–450)
RBC: 4.36 x10E6/uL (ref 3.77–5.28)
RDW: 11.6 % — ABNORMAL LOW (ref 11.7–15.4)
WBC: 9 10*3/uL (ref 3.4–10.8)

## 2018-12-03 LAB — TSH: TSH: 2.42 u[IU]/mL (ref 0.450–4.500)

## 2019-01-19 ENCOUNTER — Other Ambulatory Visit: Payer: Self-pay

## 2019-01-19 ENCOUNTER — Telehealth (INDEPENDENT_AMBULATORY_CARE_PROVIDER_SITE_OTHER): Payer: BC Managed Care – PPO | Admitting: Family Medicine

## 2019-01-19 DIAGNOSIS — J029 Acute pharyngitis, unspecified: Secondary | ICD-10-CM | POA: Diagnosis not present

## 2019-01-19 DIAGNOSIS — R5383 Other fatigue: Secondary | ICD-10-CM | POA: Diagnosis not present

## 2019-01-19 DIAGNOSIS — R6883 Chills (without fever): Secondary | ICD-10-CM | POA: Diagnosis not present

## 2019-01-19 DIAGNOSIS — R21 Rash and other nonspecific skin eruption: Secondary | ICD-10-CM | POA: Diagnosis not present

## 2019-01-19 NOTE — Progress Notes (Signed)
Virtual Visit via Telephone Note  I connected with Adelina Mings on 01/19/19 at 4:12 PM by telephone and verified that I am speaking with the correct person using two identifiers.   I discussed the limitations, risks, security and privacy concerns of performing an evaluation and management service by telephone and the availability of in person appointments. I also discussed with the patient that there may be a patient responsible charge related to this service. The patient expressed understanding and agreed to proceed, consent obtained  Chief complaint: ST, fatigue.   History of Present Illness: Thereasa Iannello is a 43 y.o. female  Has had some slight sore throat with wearing mask, but persisted last weekend. More fatigue with workout 5 days ago.thought was due to stress than usual.  More flushed in face 4 days ago - worked in yard only. ST has persisted. Chills 2 days ago. Stayed home from work.  No fever, no cough. Normal temps at home.  Slight HA yesterday.  No loss of taste or smell.  No shortness of breath.  Flushed face. No white spots on throat. Drinking and eating ok.  More fatigued than usual.  Sleeping ok. More than usual.  Neck not sore, no palpable tenderness in glands on self exam.   No known Covid 19 sick contacts. Has been at home since 1 week ago.  Working from home.     Patient Active Problem List   Diagnosis Date Noted  . GERD 01/20/2008  . ECZEMA 01/20/2008  . ELEVATED BLOOD PRESSURE WITHOUT DIAGNOSIS OF HYPERTENSION 01/20/2008  . ACL TEAR 07/10/2006   No past medical history on file. No past surgical history on file. Allergies  Allergen Reactions  . Tylenol [Acetaminophen] Palpitations  . Metronidazole    Prior to Admission medications   Medication Sig Start Date End Date Taking? Authorizing Provider  ibuprofen (ADVIL,MOTRIN) 100 MG chewable tablet Chew 400 mg by mouth every 8 (eight) hours as needed.    [provider]   Social History    Socioeconomic History  . Marital status: Single    Spouse name: Not on file  . Number of children: Not on file  . Years of education: Not on file  . Highest education level: Not on file  Occupational History  . Not on file  Social Needs  . Financial resource strain: Not on file  . Food insecurity    Worry: Not on file    Inability: Not on file  . Transportation needs    Medical: Not on file    Non-medical: Not on file  Tobacco Use  . Smoking status: Never Smoker  . Smokeless tobacco: Never Used  Substance and Sexual Activity  . Alcohol use: Yes    Alcohol/week: 2.0 standard drinks    Types: 2 Glasses of wine per week  . Drug use: Never  . Sexual activity: Yes  Lifestyle  . Physical activity    Days per week: Not on file    Minutes per session: Not on file  . Stress: Not on file  Relationships  . Social Herbalist on phone: Not on file    Gets together: Not on file    Attends religious service: Not on file    Active member of club or organization: Not on file    Attends meetings of clubs or organizations: Not on file    Relationship status: Not on file  . Intimate partner violence    Fear of current or ex  partner: Not on file    Emotionally abused: Not on file    Physically abused: Not on file    Forced sexual activity: Not on file  Other Topics Concern  . Not on file  Social History Narrative  . Not on file     Observations/Objective: Speaking in full sentences, no distress.  Assessment and Plan: Sore throat - Plan: Novel Coronavirus, NAA ([facility])  Fatigue, unspecified type - Plan: Novel Coronavirus, NAA ([facility])  Chills - Plan: Novel Coronavirus, NAA ([facility])  Onset of symptoms 5 days ago.  Afebrile, but other symptoms concerning for possible early COVID-19 infection versus other viral illness.   Continued quarantine/self isolation discussed  continue symptomatic care with fluids, Tylenol if needed, testing ordered.  ER/urgent care  precautions discussed.  Follow Up Instructions: As needed.    I discussed the assessment and treatment plan with the patient. The patient was provided an opportunity to ask questions and all were answered. The patient agreed with the plan and demonstrated an understanding of the instructions.   The patient was advised to call back or seek an in-person evaluation if the symptoms worsen or if the condition fails to improve as anticipated.  I provided 11 minutes of non-face-to-face time during this encounter.  Signed,   Meredith Staggers, MD [facility].  01/19/19

## 2019-01-19 NOTE — Progress Notes (Signed)
Cc- sore throat/ fatigue and face flush- since Thurs 01/13/19 patient stated she has been having some chills as well. Normal tempeture.

## 2019-01-20 ENCOUNTER — Other Ambulatory Visit: Payer: Self-pay

## 2019-01-20 DIAGNOSIS — Z20822 Contact with and (suspected) exposure to covid-19: Secondary | ICD-10-CM

## 2019-01-21 LAB — NOVEL CORONAVIRUS, NAA: SARS-CoV-2, NAA: NOT DETECTED

## 2019-01-21 MED ORDER — PREDNISONE 20 MG PO TABS: ORAL_TABLET | ORAL | 0 refills | Status: DC

## 2019-01-21 NOTE — Progress Notes (Signed)
4:41 PM 01/21/19  Spoke with patient.  Updated information.  Has now had more itching, rash of face, cheeks, nose.  Some blistering noted. No other new symptoms, feeling ok otherwise.   On recollection of history, was working in the yard around elbows, and did remove a line.  Was wearing gloves and long sleeves.  Possibly poison ivy.  Denies rash around the eyes, or vision issues.  Has taken prednisone/Medrol Dosepak in the past without difficulty.  Covid testing pending but unlikely.  with further information likely contact dermatitis with poison ivy.  As it is affecting her face will start prednisone with prolonged taper.  Potential side effects and risks were discussed with RTC precautions given.  Symptomatic care discussed.

## 2019-01-21 NOTE — Addendum Note (Signed)
Addended by: Merri Ray R on: 01/21/2019 04:44 PM   Modules accepted: Orders

## 2019-01-26 ENCOUNTER — Ambulatory Visit (INDEPENDENT_AMBULATORY_CARE_PROVIDER_SITE_OTHER): Payer: BC Managed Care – PPO | Admitting: Family Medicine

## 2019-01-26 ENCOUNTER — Encounter: Payer: Self-pay | Admitting: Family Medicine

## 2019-01-26 ENCOUNTER — Other Ambulatory Visit: Payer: Self-pay

## 2019-01-26 VITALS — BP 138/80 | HR 60 | Temp 97.7°F | Wt 168.4 lb

## 2019-01-26 DIAGNOSIS — R232 Flushing: Secondary | ICD-10-CM

## 2019-01-26 DIAGNOSIS — R21 Rash and other nonspecific skin eruption: Secondary | ICD-10-CM | POA: Diagnosis not present

## 2019-01-26 DIAGNOSIS — L719 Rosacea, unspecified: Secondary | ICD-10-CM

## 2019-01-26 DIAGNOSIS — L299 Pruritus, unspecified: Secondary | ICD-10-CM | POA: Diagnosis not present

## 2019-01-26 MED ORDER — VALACYCLOVIR HCL 1 G PO TABS: 1000.0000 mg | ORAL_TABLET | Freq: Three times a day (TID) | ORAL | 0 refills | Status: DC

## 2019-01-26 NOTE — Progress Notes (Signed)
Subjective:  Patient ID: Genesia Caslin, female    DOB: 1975/02/20  Age: 43 y.o. MRN: [id number]  CC:  Chief Complaint  Patient presents with  . Allergic Reaction    itchy all over. Was given prednisone by Dr Neva Seat. Little better but still burning and itching. Not sure the prednisone is working    HPI Harshini Trent presents for   Pruritus: Initial evaluation by telemedicine visit December 2.  Slight throat irritation with wearing mask but persistent that previous weekend.  Slight fatigue with workouts but not may have been due to stress.  Noticed flushing sensation in face 4 days previous.  At work to me on prior to that time.  No fevers, no cough, possible chills 2 days prior. Ultimately COVID-19 testing was negative, and further history obtained 5 days ago.  More itching, rash of face, cheeks and nose noted with slight blistering.  Did recall working in the yard around possible poison ivy? - there was ivy in [place].  Started on prednisone taper 60 mg to 10 mg for suspected contact dermatitis.  Other symptomatic care discussed.  Since phone call 5 days ago.  Rode bike 5 days ago - spinning, indoor - more flushed.  Felt better after prednisone 4 days ago. Felt worse -Face flushed, head itchy, eyes burning after shower. No new derm products/face wash.  Eye itchy/watery 3 days ago. 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  Slight itchy scalp, burning sensation in face, eyes better today.  Both eyes irritated. Both cheeks flushed, but possible blister on tip of nose and under R eye 2 days ago. Resolving now.  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 abreva in past.  Itchy ears (both)   No other body rash/itching. No genital/mouth ulcers/lesions.   No known history of rosacea.    History Patient Active Problem List   Diagnosis Date Noted  . GERD 01/20/2008  . ECZEMA 01/20/2008  . ELEVATED BLOOD PRESSURE WITHOUT DIAGNOSIS OF HYPERTENSION  01/20/2008  . ACL TEAR 07/10/2006   History reviewed. No pertinent past medical history. History reviewed. No pertinent surgical history. Allergies  Allergen Reactions  . Tylenol [Acetaminophen] Palpitations  . Metronidazole    Prior to Admission medications   Medication Sig Start Date End Date Taking? Authorizing Provider  ibuprofen (ADVIL,MOTRIN) 100 MG chewable tablet Chew 400 mg by mouth every 8 (eight) hours as needed.   Yes [provider]  predniSONE (DELTASONE) 20 MG tablet 3 by mouth for 3 days, then 2 by mouth for 2 days, then 1 by mouth for 2 days, then 1/2 by mouth for 2 days. 01/21/19  Yes Shade Flood, MD   Social History   Socioeconomic History  . Marital status: Single    Spouse name: Not on file  . Number of children: Not on file  . Years of education: Not on file  . Highest education level: Not on file  Occupational History  . Not on file  Tobacco Use  . Smoking status: Never Smoker  . Smokeless tobacco: Never Used  Substance and Sexual Activity  . Alcohol use: Yes    Alcohol/week: 2.0 standard drinks    Types: 2 Glasses of wine per week  . Drug use: Never  . Sexual activity: Yes  Other Topics Concern  . Not on file  Social History Narrative  . Not on file   Social Determinants of Health   Financial Resource Strain:   .  Difficulty of Paying Living Expenses: Not on file  Food Insecurity:   . Worried About Programme researcher, broadcasting/film/videounning Out of Food in the Last Year: Not on file  . Ran Out of Food in the Last Year: Not on file  Transportation Needs:   . Lack of Transportation (Medical): Not on file  . Lack of Transportation (Non-Medical): Not on file  Physical Activity:   . Days of Exercise per Week: Not on file  . Minutes of Exercise per Session: Not on file  Stress:   . Feeling of Stress : Not on file  Social Connections:   . Frequency of Communication with Friends and Family: Not on file  . Frequency of Social Gatherings with Friends and Family: Not on  file  . Attends Religious Services: Not on file  . Active Member of Clubs or Organizations: Not on file  . Attends BankerClub or Organization Meetings: Not on file  . Marital Status: Not on file  Intimate Partner Violence:   . Fear of Current or Ex-Partner: Not on file  . Emotionally Abused: Not on file  . Physically Abused: Not on file  . Sexually Abused: Not on file    Review of Systems   Objective:   Vitals:   01/26/19 1625 01/26/19 1645  BP: (!) 145/83 138/80  Pulse: 60   Temp: 97.7 F (36.5 C)   TempSrc: Temporal   SpO2: 99%   Weight: 168 lb 6.4 oz (76.4 kg)      Physical Exam Vitals signs reviewed.  Constitutional:      General: She is not in acute distress.    Appearance: She is well-developed.  HENT:     Head: Normocephalic and atraumatic.      Right Ear: External ear normal.     Left Ear: External ear normal.     Nose: Nose normal.  Cardiovascular:     Rate and Rhythm: Normal rate.  Pulmonary:     Effort: Pulmonary effort is normal.     Breath sounds: Normal breath sounds. No stridor. No wheezing.  Neurological:     Mental Status: She is alert and oriented to person, place, and time.  Psychiatric:        Mood and Affect: Mood normal.        Behavior: Behavior normal.     Anterior chamber clear,  Proparacaine 2 gtts applied bilaterally, Fluorescein applied, no uptake. No dendrites.   Flushed with sterile eye wash, no complications.    Assessment & Plan:  Tamala FothergillJessica Doughty is a 43 y.o. female . Rash of face - Plan: valACYclovir (VALTREX) 1000 MG tablet  Itchy scalp  Facial flushing  Rosacea  On exam and with further information less likely contact dermatitis/poison ivy, but is finishing prednisone.   - Differential includes rosacea with flare from stress, exercise.  Mild symptoms at present.  Metronidazole allergy, decided against medication, symptomatic care, prevention techniques initially discussed with handout given.  -With reports of vesicles  on right side of face, nose, started Valtrex given recent timing, although less likely shingles.  Fluorescein stain as above without dendrites/zoster ophthalmicus.  -Update on symptoms next few days, with RTC precautions given.  Meds ordered this encounter  Medications  . valACYclovir (VALTREX) 1000 MG tablet    Sig: Take 1 tablet (1,000 mg total) by mouth 3 (three) times daily.    Dispense:  21 tablet    Refill:  0   Patient Instructions     Based on mild symptoms, if you do  have a component of rosacea would not necessarily recommend topical treatment at this time.  See information below and typical triggers.  Gentle face cleansing also recommended.  I do not see any concerns on the eye exam.  Based on previous rash and location, can start Valtrex for possible shingles although that is less likely at this time.  Okay to finish prednisone.  Synthetic eyedrops or Zaditor drops if needed for itching.  Let me know how things are going the next 3 to 4 days.    Rosacea Rosacea is a long-term (chronic) condition that affects the skin of the face, including the cheeks, nose, forehead, and chin. This condition can also affect the eyes. Rosacea causes blood vessels near the surface of the skin to enlarge, which results in redness. What are the causes? The cause of this condition is not known. Certain triggers can make rosacea worse, including:  Hot baths.  Exercise.  Sunlight.  Very hot or cold temperatures.  Hot or spicy foods and drinks.  Drinking alcohol.  Stress.  Taking blood pressure medicine.  Long-term use of topical steroids on the face. What increases the risk? You are more likely to develop this condition if you:  Are older than 43 years of age.  Are a woman.  Have light-colored skin (light complexion).  Have a family history of rosacea. What are the signs or symptoms? Symptoms of this condition include:  Redness of the face.  Red bumps or pimples on the  face.  A red, enlarged nose.  Blushing easily.  Red lines on the skin.  Irritated, burning, or itchy feeling in the eyes.  Swollen eyelids.  Drainage from the eyes.  Feeling like there is something in your eye. How is this diagnosed? This condition is diagnosed with a medical history and physical exam. How is this treated? There is no cure for this condition, but treatment can help to control your symptoms. Your health care provider may recommend that you see a skin specialist (dermatologist). Treatment may include:  Medicines that are applied to the skin or taken by mouth (orally). This can include antibiotic medicines.  Laser treatment to improve the appearance of the skin.  Surgery. This is rare. Your health care provider will also recommend the best way to take care of your skin. Even after your skin improves, you will likely need to continue treatment to prevent your rosacea from coming back. Follow these instructions at home: Skin care Take care of your skin as told by your health care provider. You may be told to do these things:  Wash your skin gently two or more times each day.  Use mild soap.  Use a sunscreen or sunblock with SPF 30 or greater.  Use gentle cosmetics that are meant for sensitive skin.  Shave with an electric shaver instead of a blade. Lifestyle  Try to keep track of what foods trigger this condition. Avoid any triggers. These may include: ? Spicy foods. ? Seafood. ? Cheese. ? Hot liquids. ? Nuts. ? Chocolate. ? Iodized salt.  Do not drink alcohol.  Avoid extremely cold or hot temperatures.  Try to reduce your stress. If you need help, talk with your health care provider.  When you exercise, do these things to stay cool: ? Limit sun exposure to your face. ? Use a fan. ? Do shorter and more frequent intervals of exercise. General instructions  Take and apply over-the-counter and prescription medicines only as told by your health  care provider.  If you were prescribed an antibiotic medicine, apply it or take it as told by your health care provider. Do not stop using the antibiotic even if your condition improves.  If your eyelids are affected, apply warm compresses to them. Do this as told by your health care provider.  Keep all follow-up visits as told by your health care provider. This is important. Contact a health care provider if:  Your symptoms get worse.  Your symptoms do not improve after 2 months of treatment.  You have new symptoms.  You have any changes in vision or you have problems with your eyes, such as redness or itching.  You feel depressed.  You lose your appetite.  You have trouble concentrating. Summary  Rosacea is a long-term (chronic) condition that affects the skin of the face, including the cheeks, nose, forehead, and chin.  Take care of your skin as told by your health care provider.  Take and apply over-the-counter and prescription medicines only as told by your health care provider.  Contact a health care provider if your symptoms get worse or if you have any changes in vision or other problems with your eyes, such as redness or itching.  Keep all follow-up visits as told by your health care provider. This is important. This information is not intended to replace advice given to you by your health care provider. Make sure you discuss any questions you have with your health care provider. Document Released: 03/13/2004 Document Revised: 07/08/2017 Document Reviewed: 07/08/2017 Elsevier Patient Education  [employer].   If you have lab work done today you will be contacted with your lab results within the next 2 weeks.  If you have not heard from [place] then please contact us. The fastest way to get your results is to register for My Chart.   IF you received an x-ray today, you will receive an invoice from [facility]. Please contact [facility] at  [phone] with questions or concerns regarding your invoice.   IF you received labwork today, you will receive an invoice from [facility]. Please contact [facility] at [phone] with questions or concerns regarding your invoice.   Our billing staff will not be able to assist you with questions regarding bills from these companies.  You will be contacted with the lab results as soon as they are available. The fastest way to get your results is to activate your My Chart account. Instructions are located on the last page of this paperwork. If you have not heard from [place] regarding the results in 2 weeks, please contact this office.          Signed, Merri Ray, MD [facility]

## 2019-01-26 NOTE — Patient Instructions (Addendum)
Based on mild symptoms, if you do have a component of rosacea would not necessarily recommend topical treatment at this time.  See information below and typical triggers.  Gentle face cleansing also recommended.  I do not see any concerns on the eye exam.  Based on previous rash and location, can start Valtrex for possible shingles although that is less likely at this time.  Okay to finish prednisone.  Synthetic eyedrops or Zaditor drops if needed for itching.  Let me know how things are going the next 3 to 4 days.    Rosacea Rosacea is a long-term (chronic) condition that affects the skin of the face, including the cheeks, nose, forehead, and chin. This condition can also affect the eyes. Rosacea causes blood vessels near the surface of the skin to enlarge, which results in redness. What are the causes? The cause of this condition is not known. Certain triggers can make rosacea worse, including:  Hot baths.  Exercise.  Sunlight.  Very hot or cold temperatures.  Hot or spicy foods and drinks.  Drinking alcohol.  Stress.  Taking blood pressure medicine.  Long-term use of topical steroids on the face. What increases the risk? You are more likely to develop this condition if you:  Are older than 43 years of age.  Are a woman.  Have light-colored skin (light complexion).  Have a family history of rosacea. What are the signs or symptoms? Symptoms of this condition include:  Redness of the face.  Red bumps or pimples on the face.  A red, enlarged nose.  Blushing easily.  Red lines on the skin.  Irritated, burning, or itchy feeling in the eyes.  Swollen eyelids.  Drainage from the eyes.  Feeling like there is something in your eye. How is this diagnosed? This condition is diagnosed with a medical history and physical exam. How is this treated? There is no cure for this condition, but treatment can help to control your symptoms. Your health care provider may  recommend that you see a skin specialist (dermatologist). Treatment may include:  Medicines that are applied to the skin or taken by mouth (orally). This can include antibiotic medicines.  Laser treatment to improve the appearance of the skin.  Surgery. This is rare. Your health care provider will also recommend the best way to take care of your skin. Even after your skin improves, you will likely need to continue treatment to prevent your rosacea from coming back. Follow these instructions at home: Skin care Take care of your skin as told by your health care provider. You may be told to do these things:  Wash your skin gently two or more times each day.  Use mild soap.  Use a sunscreen or sunblock with SPF 30 or greater.  Use gentle cosmetics that are meant for sensitive skin.  Shave with an electric shaver instead of a blade. Lifestyle  Try to keep track of what foods trigger this condition. Avoid any triggers. These may include: ? Spicy foods. ? Seafood. ? Cheese. ? Hot liquids. ? Nuts. ? Chocolate. ? Iodized salt.  Do not drink alcohol.  Avoid extremely cold or hot temperatures.  Try to reduce your stress. If you need help, talk with your health care provider.  When you exercise, do these things to stay cool: ? Limit sun exposure to your face. ? Use a fan. ? Do shorter and more frequent intervals of exercise. General instructions  Take and apply over-the-counter and prescription medicines only  as told by your health care provider.  If you were prescribed an antibiotic medicine, apply it or take it as told by your health care provider. Do not stop using the antibiotic even if your condition improves.  If your eyelids are affected, apply warm compresses to them. Do this as told by your health care provider.  Keep all follow-up visits as told by your health care provider. This is important. Contact a health care provider if:  Your symptoms get worse.  Your  symptoms do not improve after 2 months of treatment.  You have new symptoms.  You have any changes in vision or you have problems with your eyes, such as redness or itching.  You feel depressed.  You lose your appetite.  You have trouble concentrating. Summary  Rosacea is a long-term (chronic) condition that affects the skin of the face, including the cheeks, nose, forehead, and chin.  Take care of your skin as told by your health care provider.  Take and apply over-the-counter and prescription medicines only as told by your health care provider.  Contact a health care provider if your symptoms get worse or if you have any changes in vision or other problems with your eyes, such as redness or itching.  Keep all follow-up visits as told by your health care provider. This is important. This information is not intended to replace advice given to you by your health care provider. Make sure you discuss any questions you have with your health care provider. Document Released: 03/13/2004 Document Revised: 07/08/2017 Document Reviewed: 07/08/2017 Elsevier Patient Education  [employer].   If you have lab work done today you will be contacted with your lab results within the next 2 weeks.  If you have not heard from [place] then please contact us. The fastest way to get your results is to register for My Chart.   IF you received an x-ray today, you will receive an invoice from [facility]. Please contact [facility] at [phone] with questions or concerns regarding your invoice.   IF you received labwork today, you will receive an invoice from [facility]. Please contact [facility] at [phone] with questions or concerns regarding your invoice.   Our billing staff will not be able to assist you with questions regarding bills from these companies.  You will be contacted with the lab results as soon as they are available. The fastest way to get your results is to  activate your My Chart account. Instructions are located on the last page of this paperwork. If you have not heard from [place] regarding the results in 2 weeks, please contact this office.

## 2019-01-27 ENCOUNTER — Encounter: Payer: Self-pay | Admitting: Family Medicine

## 2019-02-05 ENCOUNTER — Telehealth: Payer: Self-pay | Admitting: Family Medicine

## 2019-02-05 NOTE — Telephone Encounter (Signed)
Update from previous visit.  Completed Valtrex.  Recently noticed increased irritation of face, watery eyes, small bumps on chin, below eyes after wearing mask this week.  Had wash face with Aveeno face wash day prior but has used this before.  Has been trying to minimize motion of face.  Scalp has also been itchy without new rash.  Possible change in symptoms at work but has been working at home as possible.  Although discussed potential rosacea at last visit, differential includes contact dermatitis versus allergy versus dry skin versus seborrheic dermatitis.  Trial of p.o. Benadryl over-the-counter for itching, Cetaphil or other gentle cleanser for face, over-the-counter hydrocortisone for itching areas of scalp if needed, with update of symptoms next few days.  Additionally will need referral to genetic counselor with sister diagnosed with breast cancer.  Reportedly had a 40% increased risk based on one of those tests.  More details will be provided and then will place referral.

## 2019-02-09 DIAGNOSIS — L218 Other seborrheic dermatitis: Secondary | ICD-10-CM | POA: Diagnosis not present

## 2019-02-09 DIAGNOSIS — L718 Other rosacea: Secondary | ICD-10-CM | POA: Diagnosis not present

## 2019-02-15 ENCOUNTER — Other Ambulatory Visit: Payer: Self-pay

## 2019-02-15 ENCOUNTER — Ambulatory Visit
Admission: RE | Admit: 2019-02-15 | Discharge: 2019-02-15 | Disposition: A | Discharge status: Home / Self Care | Payer: BC Managed Care – PPO | Source: Ambulatory Visit | Attending: Family Medicine | Admitting: Family Medicine

## 2019-02-15 DIAGNOSIS — Z1231 Encounter for screening mammogram for malignant neoplasm of breast: Secondary | ICD-10-CM | POA: Diagnosis not present

## 2019-12-03 ENCOUNTER — Other Ambulatory Visit: Payer: Self-pay

## 2019-12-03 ENCOUNTER — Ambulatory Visit: Payer: Self-pay | Attending: Internal Medicine

## 2019-12-03 DIAGNOSIS — Z23 Encounter for immunization: Secondary | ICD-10-CM

## 2019-12-03 NOTE — Progress Notes (Signed)
   Covid-19 Vaccination Clinic  Name:  Angelica Martinez    MRN: [id number] DOB: 1975/05/13  12/03/2019  Angelica Martinez was observed post Covid-19 immunization for 15 minutes without incident. She was provided with Vaccine Information Sheet and instruction to access the V-Safe system.   Angelica Martinez was instructed to call 911 with any severe reactions post vaccine: Marland Kitchen Difficulty breathing  . Swelling of face and throat  . A fast heartbeat  . A bad rash all over body  . Dizziness and weakness

## 2020-01-19 ENCOUNTER — Other Ambulatory Visit: Payer: Self-pay

## 2020-01-19 DIAGNOSIS — Z20822 Contact with and (suspected) exposure to covid-19: Secondary | ICD-10-CM

## 2020-01-20 LAB — SARS-COV-2, NAA 2 DAY TAT

## 2020-01-20 LAB — NOVEL CORONAVIRUS, NAA: SARS-CoV-2, NAA: NOT DETECTED

## 2020-03-30 ENCOUNTER — Other Ambulatory Visit: Payer: Self-pay | Admitting: Family Medicine

## 2020-03-30 DIAGNOSIS — Z1231 Encounter for screening mammogram for malignant neoplasm of breast: Secondary | ICD-10-CM

## 2020-03-31 ENCOUNTER — Encounter: Payer: Self-pay | Admitting: Family Medicine

## 2020-04-21 IMAGING — MG DIGITAL SCREENING BILAT W/ CAD
4 series · 4 of 4 positions shown · non-contrast
Comparison: None.

CLINICAL DATA: Screening.

EXAM:
DIGITAL SCREENING BILATERAL MAMMOGRAM WITH CAD

[R CC]
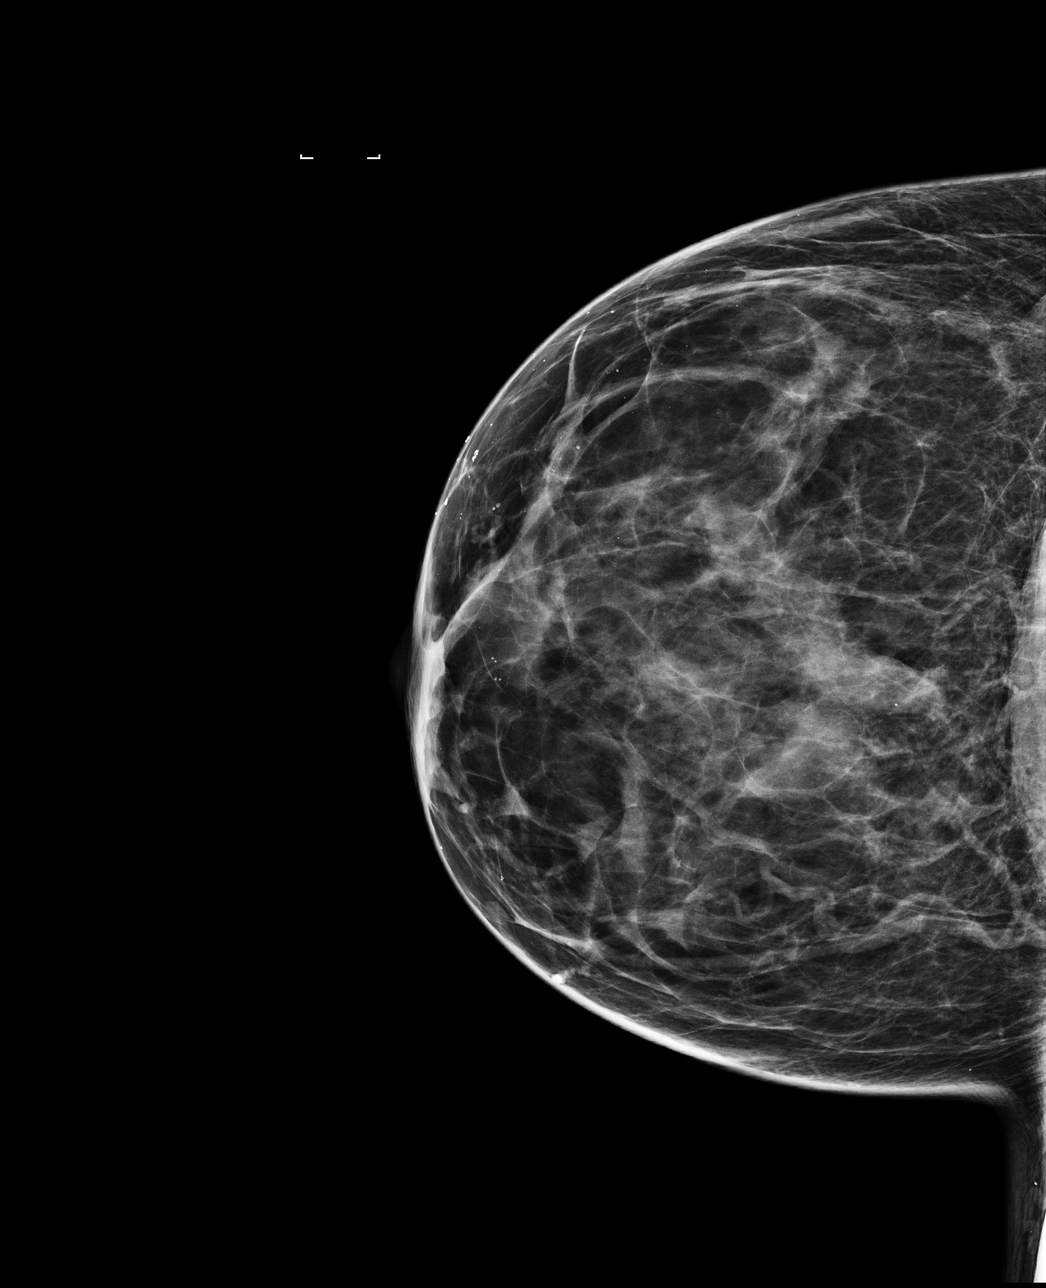

[L CC]
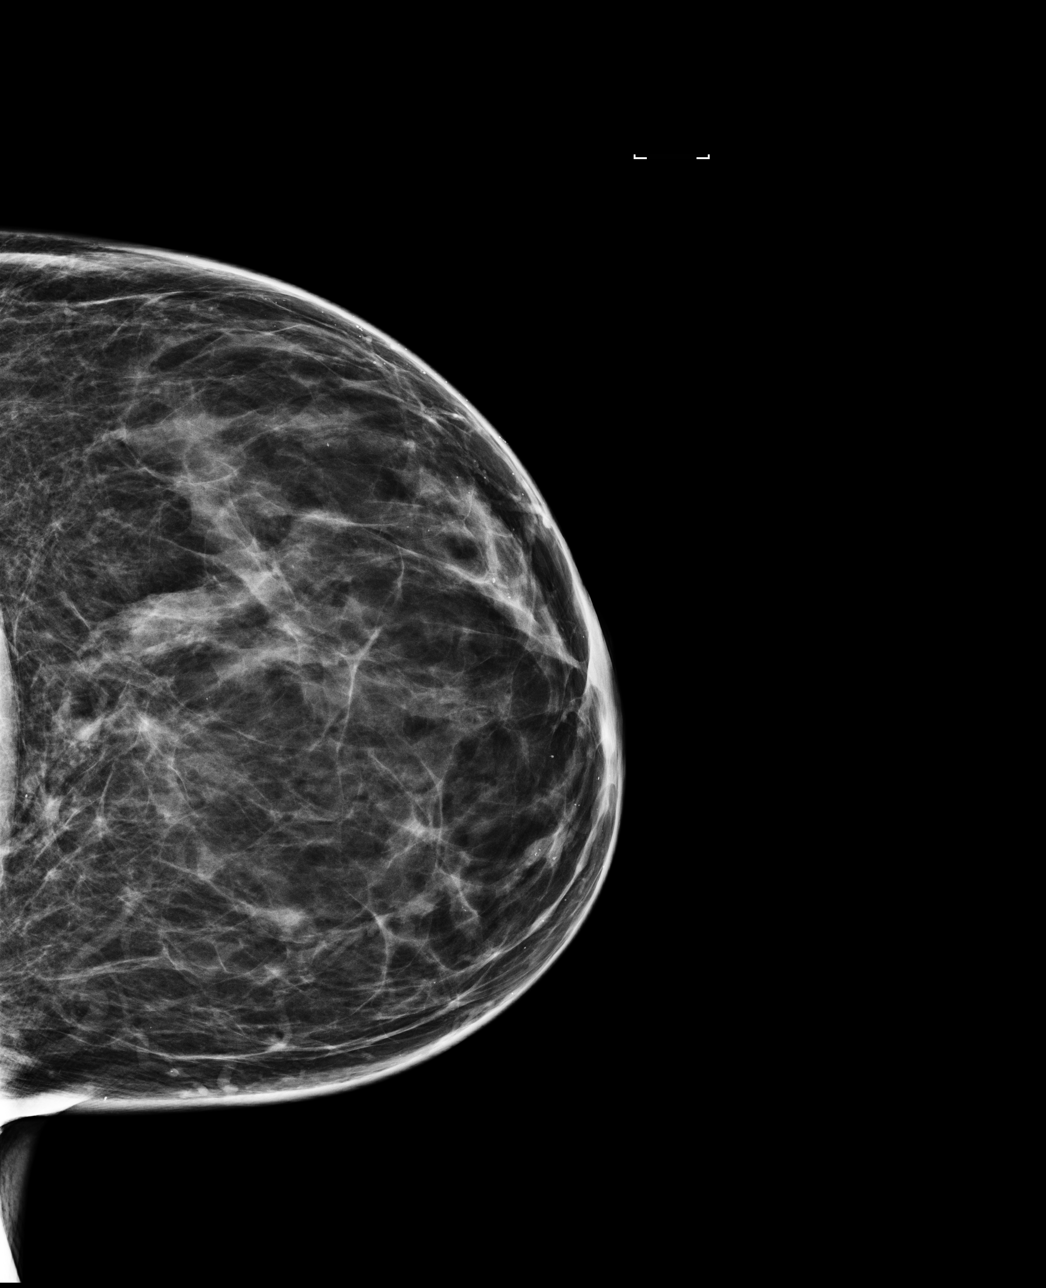

[R MLO]
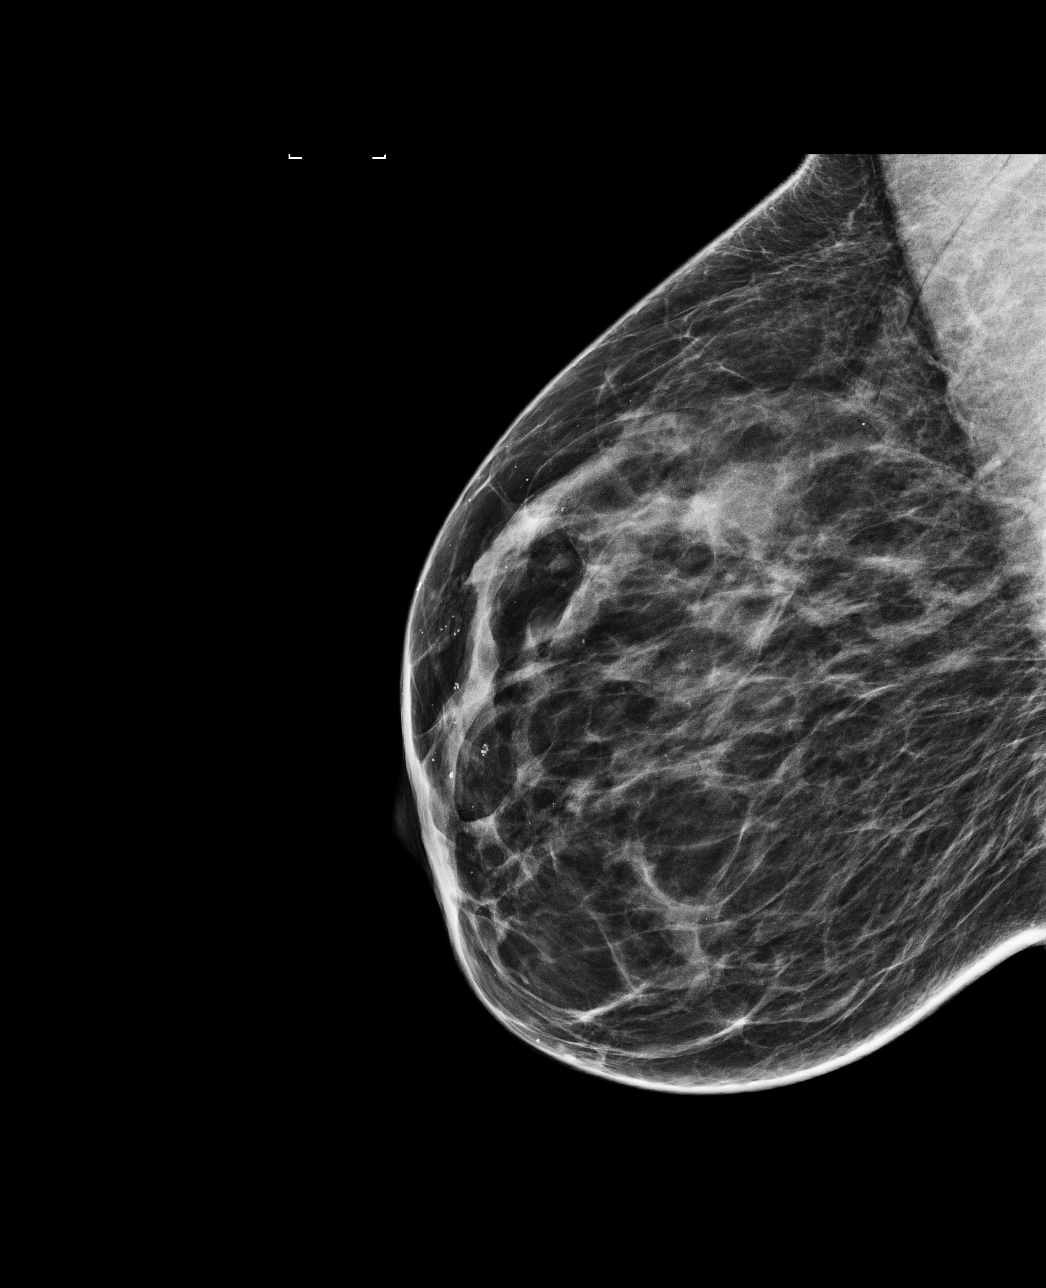

[L MLO]
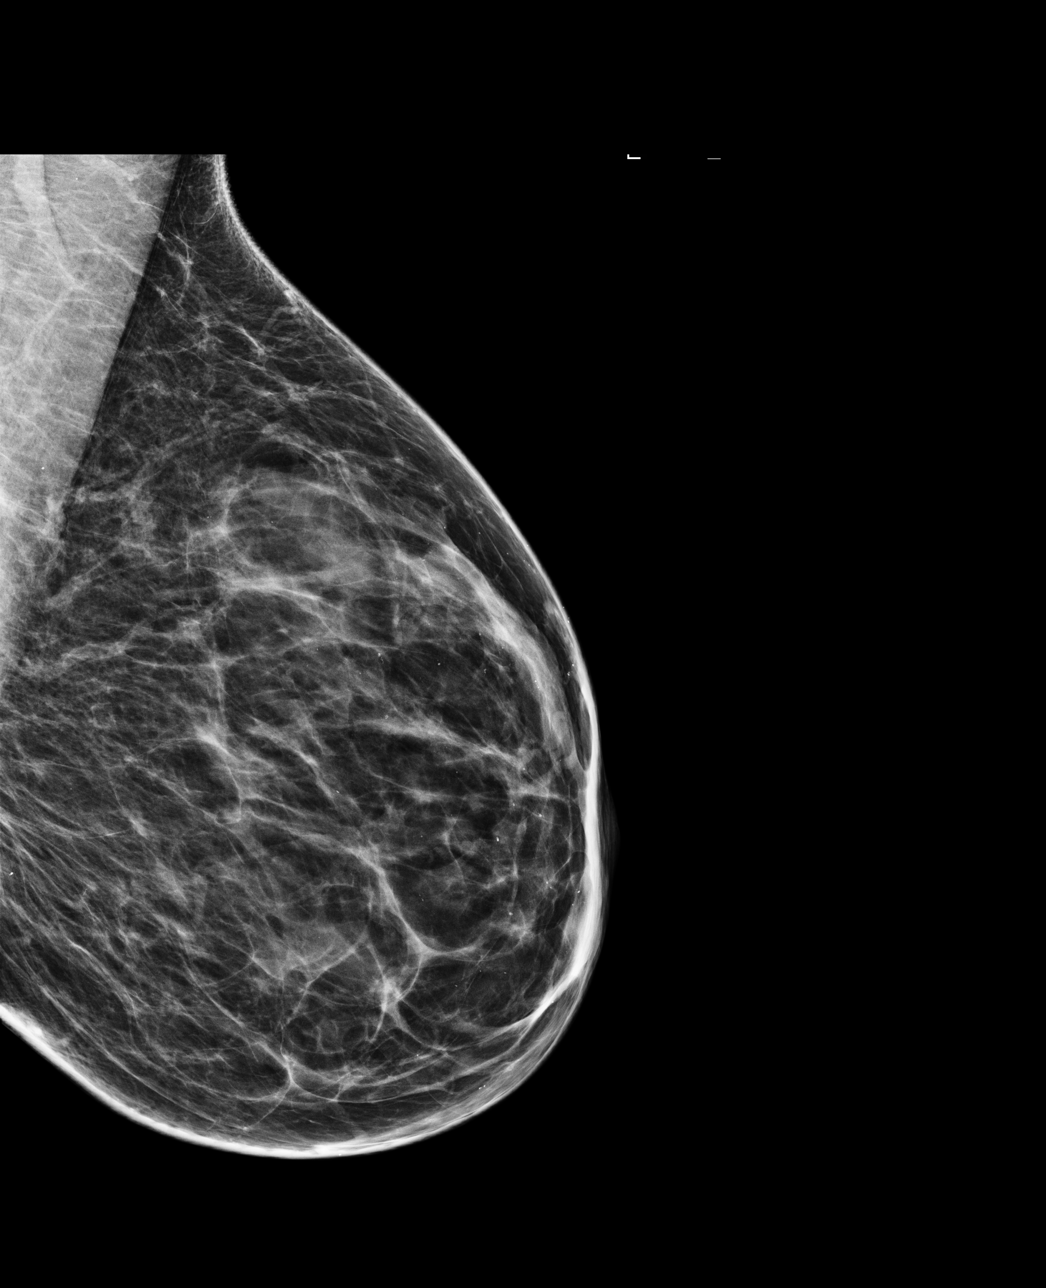

[4 of 4 positions shown; findings below may reference images not displayed]

ACR Breast Density Category c: The breast tissue is heterogeneously
dense, which may obscure small masses
FINDINGS: There are no findings suspicious for malignancy. Images were
processed with CAD.
IMPRESSION: No mammographic evidence of malignancy. A result letter of this
screening mammogram will be mailed directly to the patient.

RECOMMENDATION:
Screening mammogram in one year. (Code:[id number])

BI-RADS CATEGORY  1: Negative.

## 2020-05-07 ENCOUNTER — Telehealth (INDEPENDENT_AMBULATORY_CARE_PROVIDER_SITE_OTHER): Payer: No Typology Code available for payment source | Admitting: Family Medicine

## 2020-05-07 ENCOUNTER — Ambulatory Visit: Payer: BC Managed Care – PPO | Admitting: Family Medicine

## 2020-05-07 ENCOUNTER — Encounter: Payer: Self-pay | Admitting: Family Medicine

## 2020-05-07 ENCOUNTER — Other Ambulatory Visit: Payer: Self-pay

## 2020-05-07 VITALS — Ht 62.0 in | Wt 173.0 lb

## 2020-05-07 DIAGNOSIS — Z803 Family history of malignant neoplasm of breast: Secondary | ICD-10-CM | POA: Diagnosis not present

## 2020-05-07 NOTE — Progress Notes (Signed)
Virtual Visit via Video Note  I connected with Angelica Martinez on 05/07/20 at 5:30 PM by a video enabled telemedicine application and verified that I am speaking with the correct person using two identifiers.  Patient location:home My location: office   I discussed the limitations, risks, security and privacy concerns of performing an evaluation and management service by telephone and the availability of in person appointments. I also discussed with the patient that there may be a patient responsible charge related to this service. The patient expressed understanding and agreed to proceed, consent obtained  Chief complaint:  Chief Complaint  Patient presents with  . Referral    Pt wants a referral for genetics to have herself tested to see if she has the gene for [facility] cancer.     History of Present Illness: Angelica Martinez is a 45 y.o. female  Family history of breast cancer Older Sister diagnosed with breast cancer in August 3946, 45 years old, double mastectomy, possible BRCA positive.  No concerns on clinical breast exam by my colleague in October 2020.  Mammogram 02/15/2019 with no mammographic evidence of malignancy.  Repeat mammogram scheduled April 1 at the [facility], 3D screening. She has discovered that both her mother and older sister have the Chek 2 gene mutation. Younger sister had genetic testing and does not have the gene.   No FH of colon CA.   Health maintenance:  Referred to gynecology in October 2020 for Pap testing - referral did not go through, then some logistic difficulties. She will let me know if referral needed.   Patient Active Problem List   Diagnosis Date Noted  . GERD 01/20/2008  . ECZEMA 01/20/2008  . ELEVATED BLOOD PRESSURE WITHOUT DIAGNOSIS OF HYPERTENSION 01/20/2008  . ACL TEAR 07/10/2006   No past medical history on file. No past surgical history on file. Allergies  Allergen Reactions  . Tylenol [Acetaminophen]  Palpitations  . Metronidazole   . Other    Prior to Admission medications   Medication Sig Start Date End Date Taking? Authorizing Provider  ibuprofen (ADVIL,MOTRIN) 100 MG chewable tablet Chew 400 mg by mouth every 8 (eight) hours as needed.   Yes [provider]  predniSONE (DELTASONE) 20 MG tablet 3 by mouth for 3 days, then 2 by mouth for 2 days, then 1 by mouth for 2 days, then 1/2 by mouth for 2 days. 01/21/19  Yes Wendie Agreste, MD  valACYclovir (VALTREX) 1000 MG tablet Take 1 tablet (1,000 mg total) by mouth 3 (three) times daily. 01/26/19  Yes Wendie Agreste, MD   Social History   Socioeconomic History  . Marital status: Single    Spouse name: Not on file  . Number of children: Not on file  . Years of education: Not on file  . Highest education level: Not on file  Occupational History  . Not on file  Tobacco Use  . Smoking status: Never Smoker  . Smokeless tobacco: Never Used  Substance and Sexual Activity  . Alcohol use: Yes    Alcohol/week: 2.0 standard drinks    Types: 2 Glasses of wine per week  . Drug use: Never  . Sexual activity: Yes  Other Topics Concern  . Not on file  Social History Narrative  . Not on file   Social Determinants of Health   Financial Resource Strain: Not on file  Food Insecurity: Not on file  Transportation Needs: Not on file  Physical Activity: Not on file  Stress: Not on file  Social Connections: Not on file  Intimate Partner Violence: Not on file    Observations/Objective: Vitals:   05/07/20 1353  Weight: 173 lb (78.5 kg)  Height: 5' 2" (1.575 m)   No distress on video, appropriate responses, all questions answered with understanding of plan expressed.   Assessment and Plan: Family history of breast cancer in sister - Plan: Ambulatory referral to Genetics Referred to genetic counseling.  Mammogram pending.  Prior labs reviewed without concerns - can schedule physical later in year.  Plans to establish with  gyn - will let me know if referral needed.   Follow Up Instructions: 6-9 months for physical.    I discussed the assessment and treatment plan with the patient. The patient was provided an opportunity to ask questions and all were answered. The patient agreed with the plan and demonstrated an understanding of the instructions.   The patient was advised to call back or seek an in-person evaluation if the symptoms worsen or if the condition fails to improve as anticipated.  I provided 24 minutes of non-face-to-face time during this encounter.   Wendie Agreste, MD

## 2020-05-07 NOTE — Patient Instructions (Signed)
° ° ° °  If you have lab work done today you will be contacted with your lab results within the next 2 weeks.  If you have not heard from us then please contact us. The fastest way to get your results is to register for My Chart. ° ° °IF you received an x-ray today, you will receive an invoice from [facility]. Please contact [facility] at [phone] with questions or concerns regarding your invoice.  ° °IF you received labwork today, you will receive an invoice from [facility]. Please contact [facility] at [phone] with questions or concerns regarding your invoice.  ° °Our billing staff will not be able to assist you with questions regarding bills from these companies. ° °You will be contacted with the lab results as soon as they are available. The fastest way to get your results is to activate your My Chart account. Instructions are located on the last page of this paperwork. If you have not heard from us regarding the results in 2 weeks, please contact this office. °  ° ° ° °

## 2020-05-11 ENCOUNTER — Telehealth: Payer: Self-pay | Admitting: Genetic Counselor

## 2020-05-11 NOTE — Telephone Encounter (Signed)
Received a genetic counseling referral from Dr. Neva Seat for fhx of breast cancer. Angelica Martinez returned my call and has been scheduled to see [name] on 3/31 at 3pm. Pt aware to arrive 20 minutes early.

## 2020-05-17 ENCOUNTER — Other Ambulatory Visit: Payer: Self-pay

## 2020-05-17 ENCOUNTER — Inpatient Hospital Stay: Payer: PRIVATE HEALTH INSURANCE

## 2020-05-17 ENCOUNTER — Inpatient Hospital Stay: Payer: PRIVATE HEALTH INSURANCE | Attending: Genetic Counselor | Admitting: Genetic Counselor

## 2020-05-17 DIAGNOSIS — Z8042 Family history of malignant neoplasm of prostate: Secondary | ICD-10-CM

## 2020-05-17 DIAGNOSIS — Z803 Family history of malignant neoplasm of breast: Secondary | ICD-10-CM

## 2020-05-17 DIAGNOSIS — Z8481 Family history of carrier of genetic disease: Secondary | ICD-10-CM | POA: Diagnosis not present

## 2020-05-17 LAB — GENETIC SCREENING ORDER

## 2020-05-18 ENCOUNTER — Ambulatory Visit
Admission: RE | Admit: 2020-05-18 | Discharge: 2020-05-18 | Disposition: A | Discharge status: Home / Self Care | Payer: PRIVATE HEALTH INSURANCE | Source: Ambulatory Visit | Attending: Family Medicine | Admitting: Family Medicine

## 2020-05-18 ENCOUNTER — Encounter: Payer: Self-pay | Admitting: Genetic Counselor

## 2020-05-18 DIAGNOSIS — Z1231 Encounter for screening mammogram for malignant neoplasm of breast: Secondary | ICD-10-CM

## 2020-05-18 DIAGNOSIS — Z8481 Family history of carrier of genetic disease: Secondary | ICD-10-CM | POA: Insufficient documentation

## 2020-05-18 DIAGNOSIS — Z803 Family history of malignant neoplasm of breast: Secondary | ICD-10-CM | POA: Insufficient documentation

## 2020-05-22 ENCOUNTER — Encounter: Payer: Self-pay | Admitting: Genetic Counselor

## 2020-05-22 ENCOUNTER — Other Ambulatory Visit: Payer: Self-pay | Admitting: Family Medicine

## 2020-05-22 DIAGNOSIS — R928 Other abnormal and inconclusive findings on diagnostic imaging of breast: Secondary | ICD-10-CM

## 2020-05-22 DIAGNOSIS — Z8042 Family history of malignant neoplasm of prostate: Secondary | ICD-10-CM | POA: Insufficient documentation

## 2020-05-22 NOTE — Progress Notes (Signed)
REFERRING PROVIDER: Wendie Agreste, MD [address] [place],  [place] [address]  PRIMARY PROVIDER:  Wendie Agreste, MD  PRIMARY REASON FOR VISIT:  1. Family history of gene mutation   2. Family history of breast cancer   3. Family history of prostate cancer       HISTORY OF PRESENT ILLNESS:   Angelica Martinez, a 45 y.o. female, was seen for a [facility] cancer genetics consultation at the request of Dr. 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  [name] presents to clinic today to discuss the possibility of a hereditary predisposition to cancer, genetic testing, and to further clarify her future cancer risks, as well as potential cancer risks for family members.   [name] does not have a personal history of cancer.    RISK FACTORS:  Menarche was at age 40-8.  No live births.  OCP use for approximately 0 years.  Ovaries intact: yes.  Hysterectomy: no.  Menopausal status: premenopausal.  HRT use: 0 years. Colonoscopy: no; not examined. Mammogram within the last year: no - planned 05/18/2020. Number of breast biopsies: 1. Any excessive radiation exposure in the past: no.   Past Medical History:  Diagnosis Date  . Family history of breast cancer   . Family history of gene mutation   . Family history of prostate cancer     Past Surgical History:  Procedure Laterality Date  . BREAST CYST EXCISION Right    fatty tumor 2002    Social History   Socioeconomic History  . Marital status: Single    Spouse name: Not on file  . Number of children: Not on file  . Years of education: Not on file  . Highest education level: Not on file  Occupational History  . Not on file  Tobacco Use  . Smoking status: Never Smoker  . Smokeless tobacco: Never Used  Substance and Sexual Activity  . Alcohol use: Yes    Alcohol/week: 2.0 standard drinks    Types: 2 Glasses of wine per week  . Drug use: Never  . Sexual activity: Yes  Other  Topics Concern  . Not on file  Social History Narrative  . Not on file   Social Determinants of Health   Financial Resource Strain: Not on file  Food Insecurity: Not on file  Transportation Needs: Not on file  Physical Activity: Not on file  Stress: Not on file  Social Connections: Not on file     FAMILY HISTORY:  We obtained a detailed, 4-generation family history.  Significant diagnoses are listed below: Family History  Problem Relation Age of Onset  . Hypertension Mother   . Arthritis Mother   . Other Mother        genetic test - CHEK2 positive  . Arthritis Father   . Breast cancer Sister 34       bilateral mastectomies  . Other Sister        genetic test - CHEK2 positive (c.1100delC)  . Prostate cancer Paternal Grandfather   . Other Sister        genetic test - CHEK2 negative  . Breast cancer Maternal Great-grandmother        MGF's mother   [name] does not have children. She has one brother (age 61) and two sisters (ages 17 and 75). Her older sister was diagnosed with breast cancer at age 57 and had genetic testing that detected the c.1100delC  CHEK2 gene mutation. Her younger sister has had genetic testing that was negative for the CHEK2 mutation.  [name] mother is alive at age 82 without cancer, and also tested positive for the CHEK2 mutation. There is one maternal aunt and one maternal uncle. There is no known cancer among maternal aunts/uncles or maternal cousins. [name] maternal grandmother died at age 29 without cancer. Her maternal grandfather died older than 51 without cancer. A maternal great-grandmother (MGF's mother) had breast cancer (age of diagnosis unknown).  [name] father is alive at age 92 without cancer. There is one paternal aunt, who has not had cancer. She has one paternal cousin, who has not had cancer. [name] paternal grandmother died in her mid-70s without cancer. Her paternal grandfather died at age 65 and had a  history of prostate cancer.  [name] is aware of previous family history of genetic testing for hereditary cancer risks. Patient's maternal ancestors are of [place] and [place] descent, and paternal ancestors are of [place] and [place] descent. There is no reported Ashkenazi Jewish ancestry. There is no known consanguinity.  GENETIC COUNSELING ASSESSMENT: Ms. Lorge is a 45 y.o. female with a family history of breast cancer and a known CHEK2 gene mutation. We, therefore, discussed and recommended the following at today's visit.   DISCUSSION:  We discussed that [name] has a 50% (1 in 2) chance to also have the CHEK2 variant that was discovered in her sister and mother. We reviewed the cancer risks that are associated with CHEK2 mutations, including an increased risk of breast, colon, and prostate cancers. These estimated cancer risks vary widely and may be influenced by family history. Women with a CHEK2 deleterious mutation have approximately a 28% to 37% lifetime risk of breast cancer. Individuals with CHEK2 mutations may opt for increased cancer screening for associated cancer risks per the NCCN guidelines.  We discussed that testing is beneficial for multiple reasons including knowing about potential cancer risks and identifying screening and risk-reduction options that may be appropriate. We reviewed the characteristics, features and inheritance patterns of hereditary cancer syndromes. We also discussed genetic testing, including the appropriate family members to test, the process of testing, insurance coverage, genetic discrimination, and turn-around-time for results. We discussed the implications of a negative, positive, and variant of uncertain significance result. In particular, we discussed that even if her results are negative, she may have some increased risk for breast cancer based on the family history.    We recommended Ms. 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 We reviewed the option to pursue additional reflex genetic testing for other cancer genes, although the likelihood that there is another hereditary cancer gene mutation is low. Ms. Hackenberg is interested in this additional genetic testing. Therefore, we recommended reflex genetic testing for the [facility] Common Hereditary Cancers panel.   The Common Hereditary Cancers Panel offered by [facility] includes sequencing and/or deletion duplication testing of the following 47 genes: APC, ATM, AXIN2, BARD1, BMPR1A, BRCA1, BRCA2, BRIP1, CDH1, CDK4, CDKN2A (p14ARF), CDKN2A (p16INK4a), CHEK2, CTNNA1, DICER1, EPCAM (Deletion/duplication testing only), GREM1 (promoter region deletion/duplication testing only), KIT, MEN1, MLH1, MSH2, MSH3, MSH6, MUTYH, NBN, NF1, NTHL1, PALB2, PDGFRA, PMS2, POLD1, POLE, PTEN, RAD50, RAD51C, RAD51D, SDHB, SDHC, SDHD, SMAD4, SMARCA4. STK11, TP53, TSC1, TSC2, and VHL. The following genes are evaluated for sequence changes only: SDHA and HOXB13 c.251G>A variant only.   Based on Ms. Beaumier's family history of cancer and a  known CHEK2 mutation, she meets medical criteria for genetic testing. Despite that she meets criteria, there may still be an out of pocket cost.   Lastly, we discussed that some people do not want to undergo genetic testing due to fear of genetic discrimination. A federal law called the Genetic Information Non-Discrimination Act (GINA) of 2008 helps protect individuals against genetic discrimination based on their genetic test results. It impacts both health insurance and employment. With health insurance, it protects against increased premiums, being kicked off insurance or being forced to take a test in order to be insured. For employment it protects against hiring, firing and promoting decisions based on genetic test results. Health status due to a cancer diagnosis is not protected under GINA. Additionally, life,  disability, and long-term care insurance is not protected under GINA.   PLAN: After considering the risks, benefits, and limitations, Ms. Alegria provided informed consent to pursue genetic testing and the blood sample was sent to [facility] for analysis of the CHEK2 gene + Common Hereditary Cancers panel. Results should be available within approximately two-three weeks' time, at which point they will be disclosed by telephone to Ms. Debes, as will any additional recommendations warranted by these results. Ms. Christian will receive a summary of her genetic counseling visit and a copy of her results once available. This information will also be available in Epic.   Ms. Trower questions were answered to her satisfaction today. Our contact information was provided should additional questions or concerns arise. Thank you for the referral and allowing [place] to share in the care of your patient.   Clint Guy, [place], [facility] Licensed, Certified Dispensing [email or web address] Phone: [phone]  The patient was seen for a total of 30 minutes in face-to-face genetic counseling.  This patient was discussed with Drs. Magrinat, Lindi Adie and/or Burr Medico who agrees with the above.    _______________________________________________________________________ For Office Staff:  Number of people involved in session: 1 Was an Intern/ student involved with case: no

## 2020-05-31 DIAGNOSIS — Z1379 Encounter for other screening for genetic and chromosomal anomalies: Secondary | ICD-10-CM | POA: Insufficient documentation

## 2020-06-01 ENCOUNTER — Encounter: Payer: Self-pay | Admitting: Genetic Counselor

## 2020-06-01 ENCOUNTER — Telehealth: Payer: Self-pay | Admitting: Genetic Counselor

## 2020-06-01 ENCOUNTER — Ambulatory Visit: Payer: Self-pay | Admitting: Genetic Counselor

## 2020-06-01 DIAGNOSIS — Z1379 Encounter for other screening for genetic and chromosomal anomalies: Secondary | ICD-10-CM

## 2020-06-01 NOTE — Telephone Encounter (Signed)
LVM that her genetic test results are available and requested that she call back to discuss them.  

## 2020-06-01 NOTE — Telephone Encounter (Signed)
Revealed negative genetic testing. Angelica Martinez did not inherit the CHEK2 mutation that was previously identified in her sister and mother. Reviewed that she may still have some residually increased risk for breast cancer based on the family history, but her risk for other CHEK2-related cancers is expected to be the same as it is in the general population.

## 2020-06-01 NOTE — Progress Notes (Signed)
HPI:  [name] was previously seen in the [facility] due to a family history of a known CHEK2 gene mutation and cancer and concerns regarding a hereditary predisposition to cancer. Please refer to our prior cancer genetics clinic note for more information regarding our discussion, assessment and recommendations, at the time. [name] recent genetic test results were disclosed to her, as were recommendations warranted by these results. These results and recommendations are discussed in more detail below.  FAMILY HISTORY:  We obtained a detailed, 4-generation family history.  Significant diagnoses are listed below: Family History  Problem Relation Age of Onset  . Hypertension Mother   . Arthritis Mother   . Other Mother        genetic test - CHEK2 positive  . Arthritis Father   . Breast cancer Sister 60       bilateral mastectomies  . Other Sister        genetic test - CHEK2 positive (c.1100delC)  . Prostate cancer Paternal Grandfather   . Other Sister        genetic test - CHEK2 negative  . Breast cancer Maternal Great-grandmother        MGF's mother   [name] does not have children. She has one brother (age 53) and two sisters (ages 14 and 5). Her older sister was diagnosed with breast cancer at age 38 and had genetic testing that detected the c.1100delC CHEK2 gene mutation. Her younger sister has had genetic testing that was negative for the CHEK2 mutation.  [name] mother is alive at age 73 without cancer, and also tested positive for the CHEK2 mutation. There is one maternal aunt and one maternal uncle. There is no known cancer among maternal aunts/uncles or maternal cousins. [name] maternal grandmother died at age 70 without cancer. Her maternal grandfather died older than 79 without cancer. A maternal great-grandmother (MGF's mother) had breast cancer (age of diagnosis unknown).  [name] father is alive at age 77 without  cancer. There is one paternal aunt, who has not had cancer. She has one paternal cousin, who has not had cancer. [name] paternal grandmother died in her mid-53s without cancer. Her paternal grandfather died at age 67 and had a history of prostate cancer.  [name] is aware of previous family history of genetic testing for hereditary cancer risks. Patient's maternal ancestors are of [place] and [place] descent, and paternal ancestors are of [place] and [place] descent. There is no reported Ashkenazi Jewish ancestry. There is no known consanguinity.  GENETIC TEST RESULTS: Genetic testing reported out on 05/31/2020 through the [facility] Common Hereditary Cancers panel. No pathogenic variants were detected.   The Common Hereditary Cancers Panel offered by [facility] includes sequencing and/or deletion duplication testing of the following 47 genes: APC, ATM, AXIN2, BARD1, BMPR1A, BRCA1, BRCA2, BRIP1, CDH1, CDK4, CDKN2A (p14ARF), CDKN2A (p16INK4a), CHEK2, CTNNA1, DICER1, EPCAM (Deletion/duplication testing only), GREM1 (promoter region deletion/duplication testing only), KIT, MEN1, MLH1, MSH2, MSH3, MSH6, MUTYH, NBN, NF1, NTHL1, PALB2, PDGFRA, PMS2, POLD1, POLE, PTEN, RAD50, RAD51C, RAD51D, SDHB, SDHC, SDHD, SMAD4, SMARCA4. STK11, TP53, TSC1, TSC2, and VHL.  The following genes were evaluated for sequence changes only: SDHA and HOXB13 c.251G>A variant only. The test report will be scanned into EPIC and located under the Molecular Pathology section of the Results Review tab.  A portion of the result report is included below for reference.     We discussed with [name] that because current genetic testing is  not perfect, it is possible there may be a gene mutation in one of these genes that current testing cannot detect, but that chance is small.  We also discussed that there could be another gene that has not yet been discovered, or that we have not yet tested, that is responsible for the cancer diagnoses  in the family. It is also possible there is a hereditary cause for the cancer in the family that [name] did not inherit and therefore was not identified in her testing.  Therefore, it is important to remain in Angelica Martinez with cancer genetics in the future so that we can continue to offer [name] the most up to date genetic testing.   We recommended [name] pursue testing for the familial CHEK2 mutation called c.1100delC. [name] test was normal and did not reveal the familial mutation. We call this result a true negative result because a cancer-causing mutation was identified in [name] family, and she did not inherit it. Despite this true negative result, she likely still has some increased risk for breast cancer given the family history of breaset cancer. However, [name] chances of developing other CHEK2-related cancers, such as colon cancer, are expected to be the same as they are in the general population.  CANCER SCREENING RECOMMENDATIONS:  [name] test result is considered to be a true negative (normal). Familial mutations in the CHEK2 gene can be considered as risk factors that interact with family history and other non-genetic factors to modulate an individual's risk of cancer. As a result, individuals from families with a CHEK2 mutation who test negative for the familial mutation probably remain at some degree of elevated cancer risk if they have a family history of breast cancer. Such individuals should be managed on the basis of their family history, and might warrant enhanced surveillance even if they are 'true negative' for the mutation. Therefore, it is recommended that [name] continue to follow the cancer management and screening guidelines provided by her primary healthcare provider.   An individual's cancer risk and medical management are not determined by genetic test results alone. Overall cancer risk assessment incorporates additional factors,  including personal medical history, family history, and any available genetic information that may result in a personalized plan for cancer prevention and surveillance.  Based on [name] personal and family history, as well as her genetic test results, a statistical model Midwife) was used to estimate her risk of developing breast cancer. Tyrer-Cuzick estimates her lifetime risk of developing breast cancer to be approximately 14.5 - 29.1%, depending on if her sister's history of breast cancer is included in the model. This lifetime breast cancer risk is a preliminary estimate based on available information using one of several models endorsed by the [place] (ACS). The ACS recommends consideration of breast MRI screening as an adjunct to mammography for patients at high risk (defined as 20% or greater lifetime risk). A more detailed breast cancer risk assessment can be considered, if clinically indicated.  Given that the higher end of her estimated lifetime risk for breast cancer exceeds 20%, it is reasonable to consider high-risk breast cancer screening (annual breast MRIs and mammograms) for [name]. [name] should discuss her individual situation with her referring physician and determine a breast cancer screening plan with which they are both comfortable.We also discussed that it is reasonable for [name] to be followed by a high-risk breast cancer clinic. She would like to think about it and  will let us know if she would like a referral to the high risk breast cancer clinic.  Tyrer-Cuzick including her sister's history of breast cancer:   Tyrer-Cuzick excluding her sister's history of breast cancer:   RECOMMENDATIONS FOR FAMILY MEMBERS:  Individuals in this family might be at some increased risk of developing cancer, over the general population risk, simply due to the family history of cancer.  We recommended women in this family have a yearly mammogram  beginning at age 77, or 51 years younger than the earliest onset of cancer, an annual clinical breast exam, and perform monthly breast self-exams. Women in this family should also have a gynecological exam as recommended by their primary provider. All family members should be referred for colonoscopy starting at age 30.  FOLLOW-UP: Lastly, we discussed with [name] that cancer genetics is a rapidly advancing field and it is possible that new genetic tests will be appropriate for her and/or her family members in the future. We encouraged her to remain in contact with cancer genetics on an annual basis so we can update her personal and family histories and let her know of advances in cancer genetics that may benefit this family.   Our contact number was provided. [name] questions were answered to her satisfaction, and she knows she is welcome to call us at anytime with additional questions or concerns.   Clint Guy, MS, [facility] Genetic Counselor [email or web address] Phone: [phone]

## 2020-06-11 ENCOUNTER — Other Ambulatory Visit: Payer: Self-pay

## 2020-06-11 ENCOUNTER — Ambulatory Visit
Admission: RE | Admit: 2020-06-11 | Discharge: 2020-06-11 | Disposition: A | Discharge status: Home / Self Care | Payer: PRIVATE HEALTH INSURANCE | Source: Ambulatory Visit | Attending: Family Medicine | Admitting: Family Medicine

## 2020-06-11 DIAGNOSIS — R928 Other abnormal and inconclusive findings on diagnostic imaging of breast: Secondary | ICD-10-CM

## 2020-07-05 ENCOUNTER — Telehealth (INDEPENDENT_AMBULATORY_CARE_PROVIDER_SITE_OTHER): Payer: Worker's Compensation | Admitting: Family Medicine

## 2020-07-05 DIAGNOSIS — Z803 Family history of malignant neoplasm of breast: Secondary | ICD-10-CM | POA: Diagnosis not present

## 2020-07-05 DIAGNOSIS — M542 Cervicalgia: Secondary | ICD-10-CM

## 2020-07-05 DIAGNOSIS — G44309 Post-traumatic headache, unspecified, not intractable: Secondary | ICD-10-CM

## 2020-07-05 NOTE — Patient Instructions (Addendum)
Gentle range of motion and stretches as we discussed, heat or ice as needed for spasm.  Occasional Advil is fine as long as symptoms are improving through the weekend.  Let me know if you would like a muscle relaxant.  If any worsening symptoms please let me know or be seen through ER/urgent care.  Headache should improve as neck symptoms also improve, but let me know if that persists.  We will plan on MRI with mammogram starting next year. Let me know if there are questions.     Norton PastelYoumans and [facility] (pp. [phone]). [place]. Elsevier."> Neurosurgery, 80(1), 6-15. Retrieved on August 08, 2018.[email or web address]"> Primary Care (5th ed., pp. 218-221). [place]: Elsevier."> Rosen's Emergency Medicine: Concepts and Clinical Practice (9th ed., pp. 301-329). [place], [place]: Elsevier."> Neurosurgery, 75 Suppl 1, S3-15. Retrieved on August 08, 2018.[email or web address]">  Head Injury, Adult There are many types of head injuries. Head injuries can be as minor as a small bump, or they can be a serious medical issue. More severe head injuries include:  A jarring injury to the brain (concussion).  A bruise (contusion) of the brain. This means there is bleeding in the brain that can cause swelling.  A cracked skull (skull fracture).  Bleeding in the brain that collects, clots, and forms a bump (hematoma). After a head injury, most problems occur within the first 24 hours, but side effects may occur up to 7-10 days after the injury. It is important to watch your condition for any changes. You may need to be observed in the emergency department or urgent care, or you may be admitted to the hospital. What are the causes? There are many possible causes of a head injury. Serious head injuries may be caused by car accidents, bicycle or motorcycle accidents, sports injuries, falls, or being struck by an object. What are the  symptoms? Symptoms of a head injury include a contusion, bump, or bleeding at the site of the injury. Other physical symptoms may include:  Headache.  Nausea or vomiting.  Dizziness.  Blurred or double vision.  Being uncomfortable around bright lights or loud noises.  Seizures.  Feeling tired.  Trouble being awakened.  Loss of consciousness. Mental or emotional symptoms may include:  Irritability.  Confusion and memory problems.  Poor attention and concentration.  Changes in eating or sleeping habits.  Anxiety or depression. How is this diagnosed? This condition can usually be diagnosed based on your symptoms, a description of the injury, and a physical exam. You may also have imaging tests done, such as a CT scan or an MRI. How is this treated? Treatment for this condition depends on the severity and type of injury you have. The main goal of treatment is to prevent complications and allow the brain time to heal. Mild head injury If you have a mild head injury, you may be sent home, and treatment may include:  Observation. A responsible adult should stay with you for 24 hours after your injury and check on you often.  Physical rest.  Brain rest.  Pain medicines. Severe head injury If you have a severe head injury, treatment may include:  Close observation. This includes hospitalization with the following care: ? Frequent physical exams. ? Frequent checks of how your brain and nervous system are working (neurological status). ? Checking your blood pressure and oxygen levels.  Medicines to relieve pain, prevent seizures, and decrease brain swelling.  Airway protection and breathing support. This may include using a ventilator.  Treatments that monitor and manage swelling inside the brain.  Brain surgery. This may be needed to: ? Remove a collection of blood or blood clots. ? Stop the bleeding. ? Remove a part of the skull to allow room for the brain to  swell. Follow these instructions at home: Activity  Rest and avoid activities that are physically hard or tiring.  Make sure you get enough sleep.  Let your brain rest by limiting activities that require a lot of thought or attention, such as: ? Watching TV. ? Playing memory games and puzzles. ? Job-related work or homework. ? Working on [employer], and texting.  Avoid activities that could cause another head injury, such as playing sports, until your health care provider approves. Having another head injury, especially before the first one has healed, can be dangerous.  Ask your health care provider when it is safe for you to return to your regular activities, including work or school. Ask your health care provider for a step-by-step plan for gradually returning to activities.  Ask your health care provider when you can drive, ride a bicycle, or use heavy machinery. Your ability to react may be slower after a brain injury. Do not do these activities if you are dizzy. Lifestyle  Do not drink alcohol until your health care provider approves. Do not use drugs. Alcohol and certain drugs may slow your recovery and can put you at risk of further injury.  If it is harder than usual to remember things, write them down.  If you are easily distracted, try to do one thing at a time.  Talk with family members or close friends when making important decisions.  Tell your friends, family, a trusted colleague, and work Production designer, theatre/television/film about your injury, symptoms, and restrictions. Have them watch for any new or worsening problems.   General instructions  Take over-the-counter and prescription medicines only as told by your health care provider.  Have someone stay with you for 24 hours after your head injury. This person should watch you for any changes in your symptoms and be ready to seek medical help.  Keep all follow-up visits as told by your health care provider. This is  important. How is this prevented?  Work on improving your balance and strength to avoid falls.  Wear a seat belt when you are in a moving vehicle.  Wear a helmet when riding a bicycle, skiing, or doing any other sport or activity that has a risk of injury.  If you drink alcohol: ? Limit how much you use to:  0-1 drink a day for nonpregnant women.  0-2 drinks a day for men. ? Be aware of how much alcohol is in your drink. In the [place] one drink equals one 12 oz bottle of beer (355 mL), one 5 oz glass of wine (148 mL), or one 1 oz glass of hard liquor (44 mL).  Take safety measures in your home, such as: ? Removing clutter and tripping hazards from floors and stairways. ? Using grab bars in bathrooms and handrails by stairs. ? Placing non-slip mats on floors and in bathtubs. ? Improving lighting in dim areas. Where to find more information  Centers for Disease Control and Prevention: [employer].com.br Get help right away if:  You have: ? A severe headache that is not helped by medicine. ? Trouble walking or weakness in your arms and legs. ? Clear or bloody fluid coming from your nose or ears. ? Changes in your  vision. ? A seizure. ? Increased confusion or irritability.  Your symptoms get worse.  You are sleepier than normal and have trouble staying awake.  You lose your balance.  Your pupils change size.  Your speech is slurred.  Your dizziness gets worse.  You vomit. These symptoms may represent a serious problem that is an emergency. Do not wait to see if the symptoms will go away. Get medical help right away. Call your local emergency services (911 in the [place] Do not drive yourself to the hospital. Summary  Head injuries can be minor, or they can be a serious medical issue requiring immediate attention.  Treatment for this condition depends on the severity and type of injury you have.  Have someone stay with you for 24 hours after your injury and check on you  often.  Ask your health care provider when it is safe for you to return to your regular activities, including work or school.  Head injury prevention includes wearing a seat belt in a motor vehicle, using a helmet on a bicycle, limiting alcohol use, and taking safety measures in your home. This information is not intended to replace advice given to you by your health care provider. Make sure you discuss any questions you have with your health care provider. Document Revised: 12/17/2018 Document Reviewed: 12/17/2018 Elsevier Patient Education  2021 [employer].   Tourist information centre manager Injury, Adult After a motor vehicle collision, it is common to have injuries to the head, face, arms, and body. These injuries may include:  Cuts.  Burns.  Bruises.  Sore muscles and muscle strains.  Headaches. You may have stiffness and soreness for the first several hours. You may feel worse after waking up the first morning after the collision. These injuries often feel worse for the first 24-48 hours. Your injuries should then begin to improve with each day. How quickly you improve often depends on:  The severity of the collision.  The number of injuries you have.  The location and nature of the injuries.  Whether you were wearing a seat belt and whether your airbag deployed. A head injury may result in a concussion, which is a type of brain injury that can have serious effects. If you have a concussion, you should rest as told by your health care provider. You must be very careful to avoid having a second concussion. Follow these instructions at home: Medicines  Take over-the-counter and prescription medicines only as told by your health care provider.  If you were prescribed antibiotic medicine, take or apply it as told by your health care provider. Do not stop using the antibiotic even if your condition improves. If you have a wound or a burn:  Clean your wound or burn as told by your  health care provider. ? Wash it with mild soap and water. ? Rinse it with water to remove all soap. ? Pat it dry with a clean towel. Do not rub it. ? If you were told to put an ointment or cream on the wound, do so as told by your health care provider.  Follow instructions from your health care provider about how to take care of your wound or burn. Make sure you: ? Know when and how to change or remove your bandage (dressing). Always wash your hands with soap and water before and after you change your dressing. If soap and water are not available, use hand sanitizer. ? Leave stitches (sutures), skin glue, or adhesive strips in  place, if this applies. These skin closures may need to stay in place for 2 weeks or longer. If adhesive strip edges start to loosen and curl up, you may trim the loose edges. Do not remove adhesive strips completely unless your health care provider tells you to do that.  Do not: ? Scratch or pick at the wound or burn. ? Break any blisters you may have. ? Peel any skin.  Avoid exposing your burn or wound to the sun.  Raise (elevate) the wound or burn above the level of your heart while you are sitting or lying down. This will help reduce pain, pressure, and swelling. If you have a wound or burn on your face, you may want to sleep with your head elevated. You may do this by putting an extra pillow under your head.  Check your wound or burn every day for signs of infection. Check for: ? More redness, swelling, or pain. ? More fluid or blood. ? Warmth. ? Pus or a bad smell.   Activity  Rest. Rest helps your body to heal. Make sure you: ? Get plenty of sleep at night. Avoid staying up late. ? Keep the same bedtime hours on weekends and weekdays.  Ask your health care provider if you have any lifting restrictions. Lifting can make neck or back pain worse.  Ask your health care provider when you can drive, ride a bicycle, or use heavy machinery. Your ability to react  may be slower if you injured your head. Do not do these activities if you are dizzy.  If you are told to wear a brace on an injured arm, leg, or other part of your body, follow instructions from your health care provider about any activity restrictions related to driving, bathing, exercising, or working. General instructions  If directed, put ice on the injured areas. This can help with pain and swelling. ? Put ice in a plastic bag. ? Place a towel between your skin and the bag. ? Leave the ice on for 20 minutes, 2-3 times a day.  Drink enough fluid to keep your urine pale yellow.  Do not drink alcohol.  Maintain good nutrition.  Keep all follow-up visits as told by your health care provider. This is important.      Contact a health care provider if:  Your symptoms get worse.  You have neck pain that gets worse or has not improved after 1 week.  You have signs of infection in a wound or burn.  You have a fever.  You have any of the following symptoms for more than 2 weeks after your motor vehicle collision: ? Lasting (chronic) headaches. ? Dizziness or balance problems. ? Nausea. ? Vision problems. ? Increased sensitivity to noise or light. ? Depression or mood swings. ? Anxiety or irritability. ? Memory problems. ? Trouble concentrating or paying attention. ? Sleep problems. ? Feeling tired all the time. Get help right away if:  You have: ? Numbness, tingling, or weakness in your arms or legs. ? Severe neck pain, especially tenderness in the middle of the back of your neck. ? Changes in bowel or bladder control. ? Increasing pain in any area of your body. ? Swelling in any area of your body, especially your legs. ? Shortness of breath or light-headedness. ? Chest pain. ? Blood in your urine, stool, or vomit. ? Severe pain in your abdomen or your back. ? Severe or worsening headaches. ? Sudden vision loss or double vision.  Your eye suddenly becomes  red.  Your pupil is an odd shape or size. Summary  After a motor vehicle collision, it is common to have injuries to the head, face, arms, and body.  Follow instructions from your health care provider about how to take care of a wound or burn.  If directed, put ice on your injured areas.  Contact a health care provider if your symptoms get worse.  Keep all follow-up visits as told by your health care provider. This information is not intended to replace advice given to you by your health care provider. Make sure you discuss any questions you have with your health care provider. Document Revised: 04/19/2018 Document Reviewed: 04/21/2018 Elsevier Patient Education  2021 [employer].
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
## 2020-07-12 ENCOUNTER — Encounter: Payer: Self-pay | Admitting: Family Medicine

## 2020-07-12 ENCOUNTER — Ambulatory Visit (INDEPENDENT_AMBULATORY_CARE_PROVIDER_SITE_OTHER): Payer: Worker's Compensation | Admitting: Family Medicine

## 2020-07-12 ENCOUNTER — Other Ambulatory Visit: Payer: Self-pay

## 2020-07-12 VITALS — BP 126/74 | HR 72 | Temp 98.2°F | Resp 16 | Ht 62.0 in | Wt 179.4 lb

## 2020-07-12 DIAGNOSIS — G44309 Post-traumatic headache, unspecified, not intractable: Secondary | ICD-10-CM

## 2020-07-12 DIAGNOSIS — M542 Cervicalgia: Secondary | ICD-10-CM | POA: Diagnosis not present

## 2020-07-12 MED ORDER — CYCLOBENZAPRINE HCL 5 MG PO TABS: 2.5000 mg | ORAL_TABLET | Freq: Every evening | ORAL | 0 refills | Status: DC | PRN

## 2020-07-12 NOTE — Patient Instructions (Signed)
As we discussed it does appear that you may still have some muscular cause of neck pain, shoulder and upper back pain as well as possible contribution to your headache versus post traumatic headache/concussion.  I did not see a need for neuroimaging at this time, but if any worsening symptoms let me know right away (or urgent care/ER eval if needed).  Continue heat, ice, soft tissue work/massage as needed.  Tylenol is fine for now if you do not want to use ibuprofen.  Flexeril can be used 1/2-1 up to every 8 hours but can start at bedtime as needed to see if that will help with spasm.  If not improving into next week I would consider some neck imaging, and certainly have the option to meet with Ortho as well.  Keep me posted.
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
## 2020-10-16 ENCOUNTER — Telehealth: Payer: Self-pay | Admitting: Family Medicine
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
## 2020-11-08 ENCOUNTER — Other Ambulatory Visit: Payer: Self-pay | Admitting: Obstetrics & Gynecology

## 2020-11-08 ENCOUNTER — Ambulatory Visit: Payer: No Typology Code available for payment source | Attending: Internal Medicine

## 2020-11-08 ENCOUNTER — Other Ambulatory Visit (HOSPITAL_BASED_OUTPATIENT_CLINIC_OR_DEPARTMENT_OTHER): Payer: Self-pay

## 2020-11-08 DIAGNOSIS — Z23 Encounter for immunization: Secondary | ICD-10-CM

## 2020-11-08 DIAGNOSIS — Z803 Family history of malignant neoplasm of breast: Secondary | ICD-10-CM

## 2020-11-08 MED ORDER — PFIZER COVID-19 VAC BIVALENT 30 MCG/0.3ML IM SUSP
INTRAMUSCULAR | 0 refills | Status: DC
  Filled 2020-11-08: qty 0.3, 1d supply, fill #0

## 2020-11-08 NOTE — Progress Notes (Signed)
   Covid-19 Vaccination Clinic  Name:  Roneisha Stern    MRN: [id number] DOB: Mar 27, 1975  11/08/2020  Ms. Wygant was observed post Covid-19 immunization for 15 minutes without incident. She was provided with Vaccine Information Sheet and instruction to access the V-Safe system.   Ms. Parrales was instructed to call 911 with any severe reactions post vaccine: Difficulty breathing  Swelling of face and throat  A fast heartbeat  A bad rash all over body  Dizziness and weakness

## 2021-08-14 ENCOUNTER — Other Ambulatory Visit: Payer: Self-pay | Admitting: Family Medicine

## 2021-08-14 DIAGNOSIS — Z1231 Encounter for screening mammogram for malignant neoplasm of breast: Secondary | ICD-10-CM

## 2021-08-16 IMAGING — MG MM DIGITAL DIAGNOSTIC UNILAT*L* W/ TOMO W/ CAD
4 series · 4 of 12 positions shown · non-contrast
Comparison: Previous exam(s).

CLINICAL DATA: The patient was called back for a mass in the upper
inner left breast.

EXAM:
DIGITAL DIAGNOSTIC UNILATERAL LEFT MAMMOGRAM WITH TOMOSYNTHESIS AND
CAD; ULTRASOUND LEFT BREAST LIMITED
TECHNIQUE: Left digital diagnostic mammography and breast tomosynthesis was
performed. The images were evaluated with computer-aided detection.;
Targeted ultrasound examination of the left breast was performed

[L MLO synth-2D]
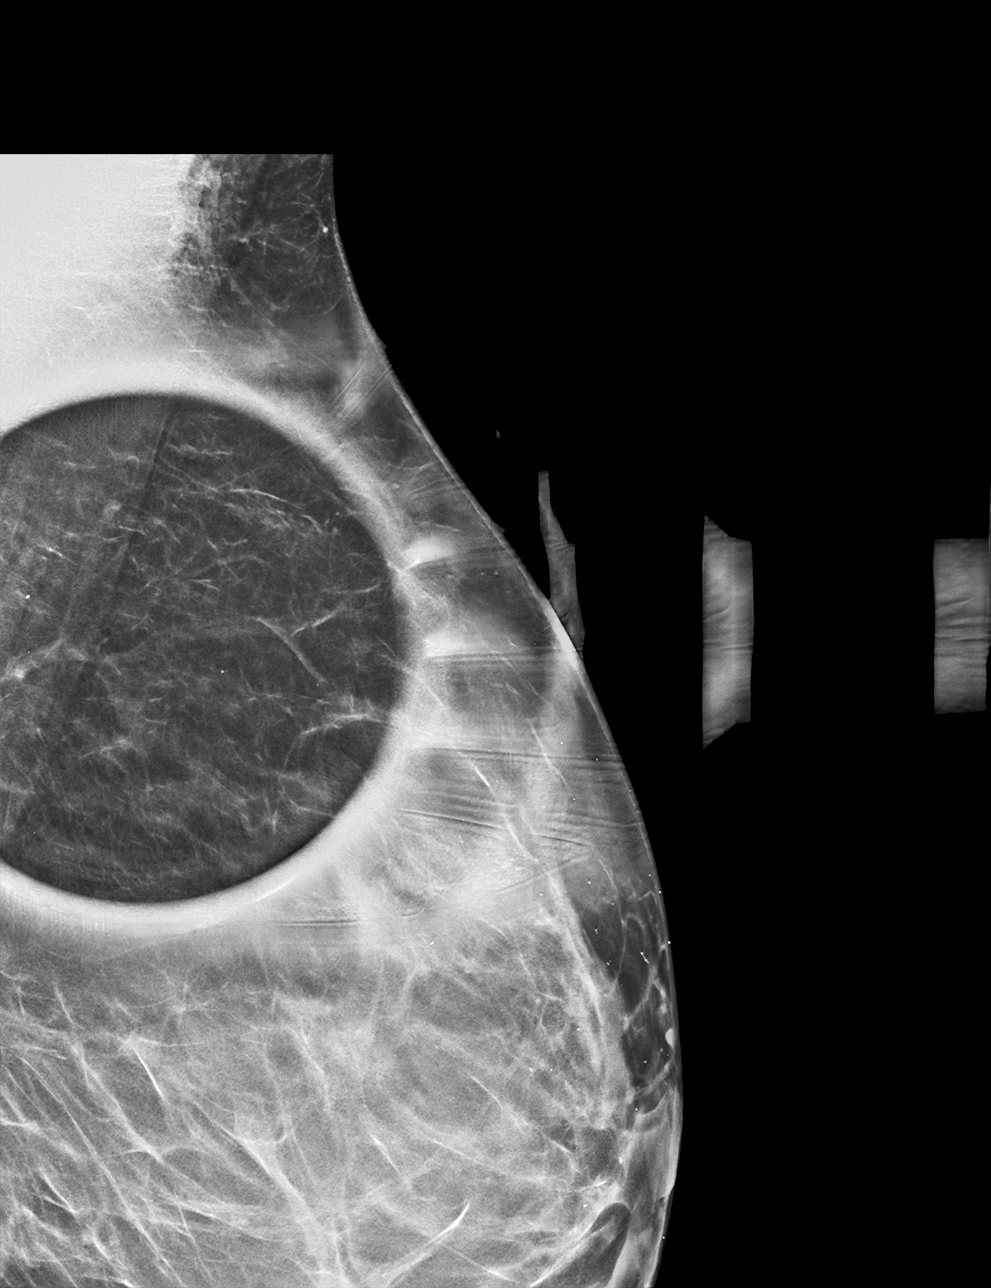

[L CC synth-2D]
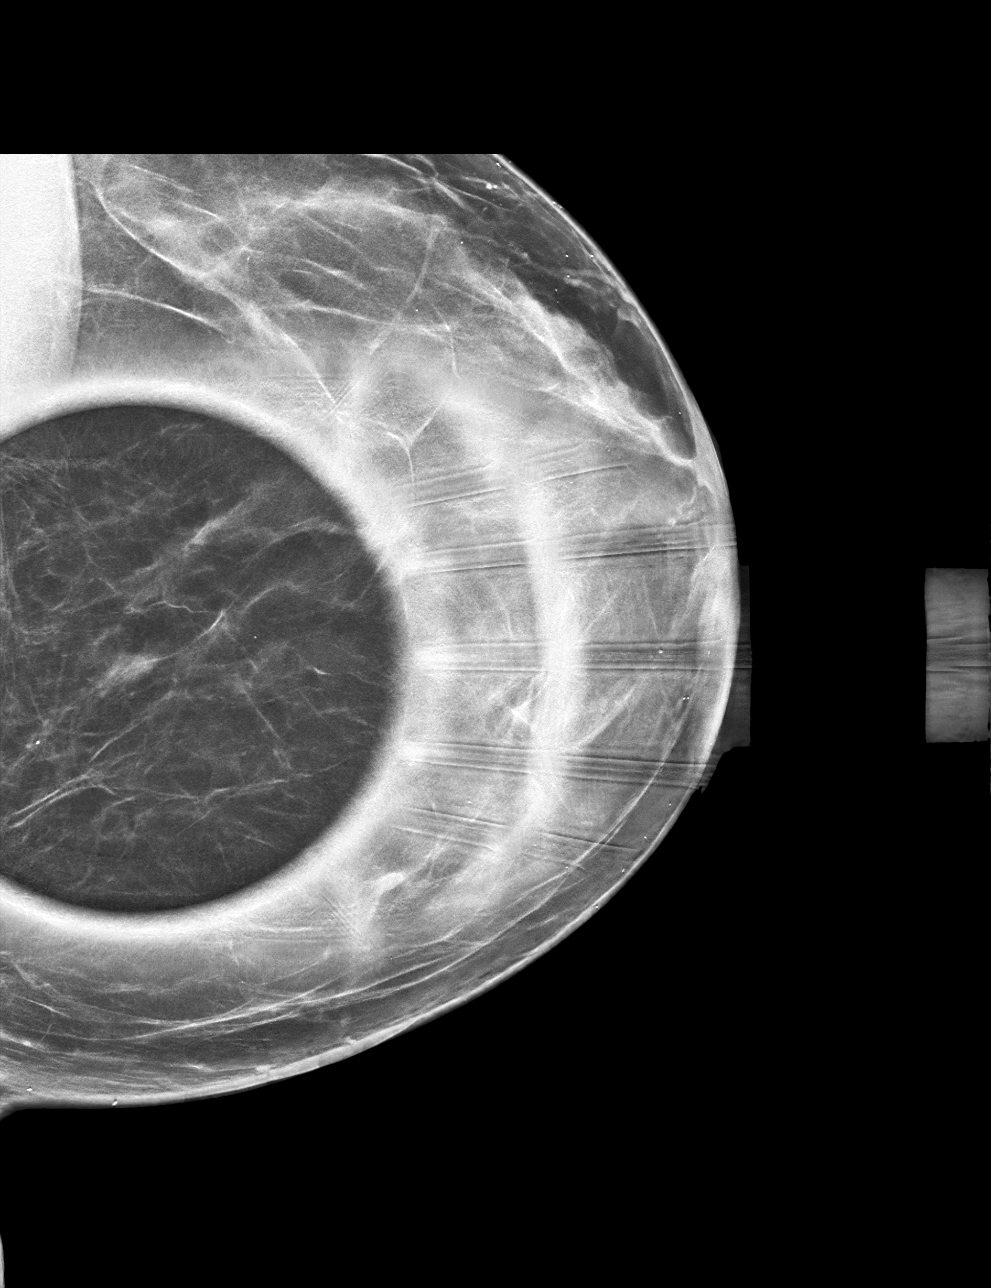

[L MLO tomo · tomo slice 32/63.0]
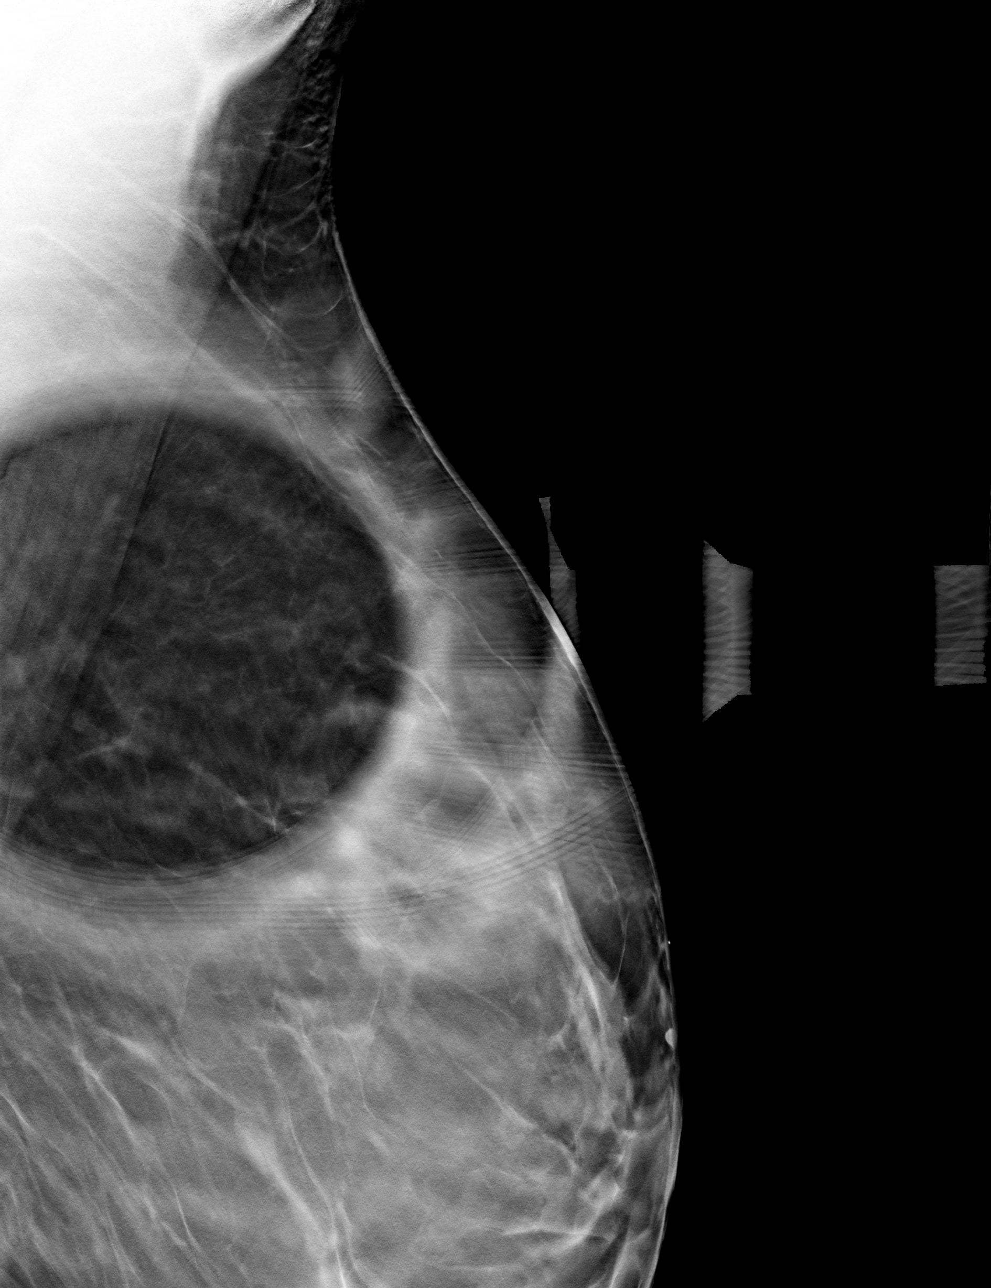

[L CC tomo · tomo slice 31/62.0]
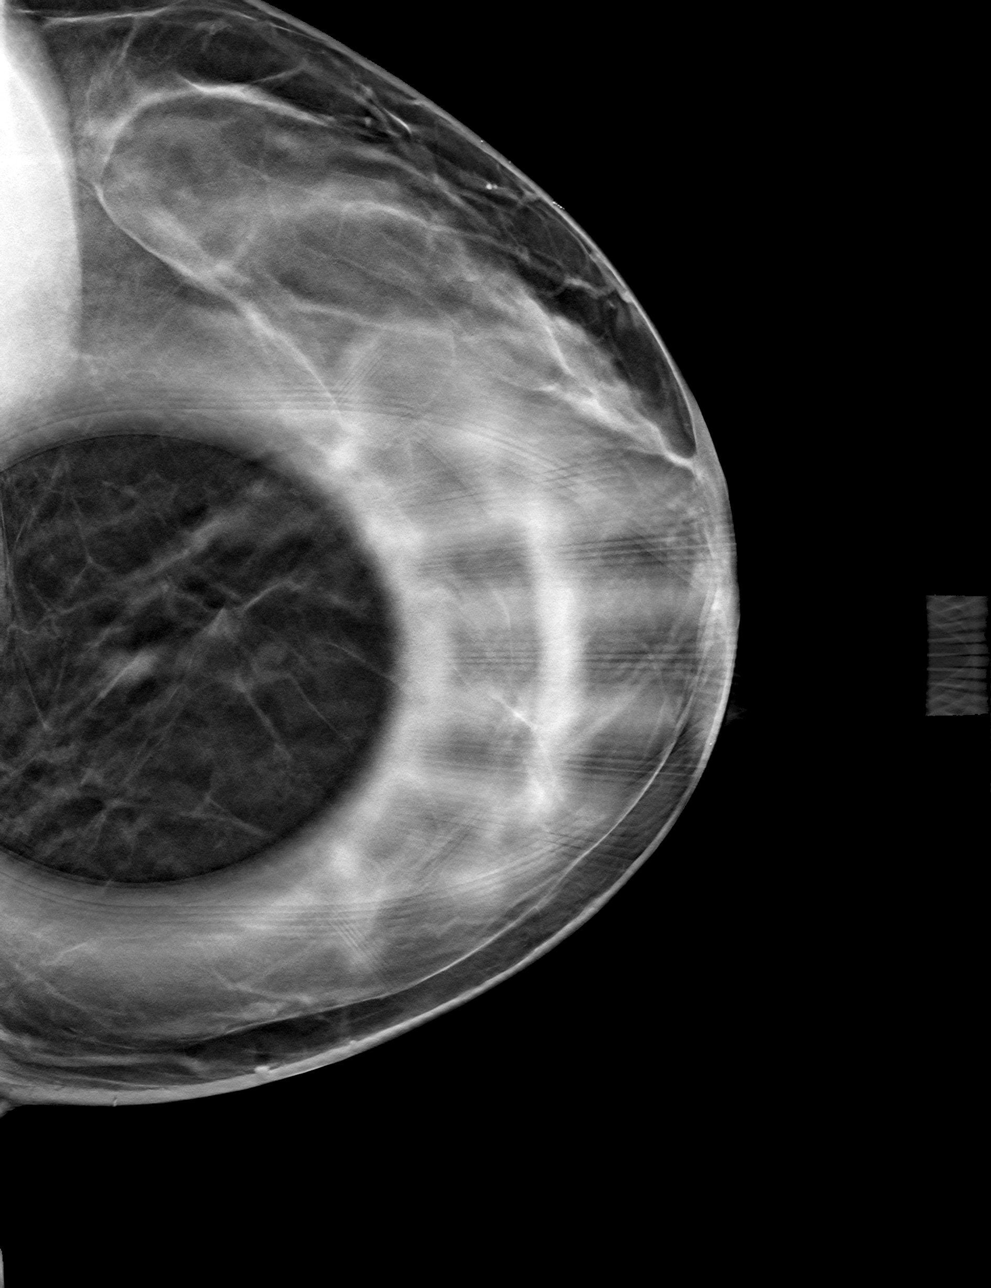

[4 of 12 positions shown; findings below may reference images not displayed]

ACR Breast Density Category b: There are scattered areas of
fibroglandular density.
FINDINGS: The mass in the upper inner left breast persists on today's imaging.

On physical exam, no suspicious lumps are identified.

Targeted ultrasound is performed, showing fibrocystic changes at 12
o'clock in the left breast accounting for the mammographically
identified mass.
IMPRESSION: Fibrocystic changes.  No evidence of malignancy.

RECOMMENDATION:
Annual screening mammography.

I have discussed the findings and recommendations with the patient.
If applicable, a reminder letter will be sent to the patient
regarding the next appointment.

BI-RADS CATEGORY  2: Benign.

## 2021-08-22 ENCOUNTER — Ambulatory Visit
Admission: RE | Admit: 2021-08-22 | Discharge: 2021-08-22 | Disposition: A | Discharge status: Home / Self Care | Payer: No Typology Code available for payment source | Source: Ambulatory Visit | Attending: Family Medicine | Admitting: Family Medicine

## 2021-08-22 DIAGNOSIS — Z1231 Encounter for screening mammogram for malignant neoplasm of breast: Secondary | ICD-10-CM

## 2021-09-11 ENCOUNTER — Encounter: Payer: No Typology Code available for payment source | Admitting: Family Medicine

## 2021-09-16 ENCOUNTER — Telehealth (INDEPENDENT_AMBULATORY_CARE_PROVIDER_SITE_OTHER): Payer: No Typology Code available for payment source | Admitting: Family Medicine

## 2021-09-16 DIAGNOSIS — R631 Polydipsia: Secondary | ICD-10-CM | POA: Diagnosis not present

## 2021-09-16 DIAGNOSIS — R634 Abnormal weight loss: Secondary | ICD-10-CM

## 2021-09-16 DIAGNOSIS — R112 Nausea with vomiting, unspecified: Secondary | ICD-10-CM | POA: Diagnosis not present

## 2021-09-16 DIAGNOSIS — U071 COVID-19: Secondary | ICD-10-CM | POA: Diagnosis not present

## 2021-09-16 DIAGNOSIS — R350 Frequency of micturition: Secondary | ICD-10-CM | POA: Diagnosis not present

## 2021-09-16 NOTE — Progress Notes (Addendum)
Virtual Visit via Video Note  I connected with Angelica Martinez on 09/16/21 at 5:18 PM by a video enabled telemedicine application and verified that I am speaking with the correct person using two identifiers.  Patient location: home.  My location: office - [place].    I discussed the limitations, risks, security and privacy concerns of performing an evaluation and management service by telephone and the availability of in person appointments. I also discussed with the patient that there may be a patient responsible charge related to this service. The patient expressed understanding and agreed to proceed, consent obtained  Chief complaint:  Chief Complaint  Patient presents with   Covid Positive    Pt notes positive 8 days ago, congestion is somewhat better and fatigue is not improved at all, notes very thirsty or dry mouth, intermittent headache, loss of appetite, body aches lower extremities vomited after dinner last night     History of Present Illness: Angelica Martinez is a 46 y.o. female  Covid 13 infection:  Initial sx sore throat 8/23, felt overheated later that day and positive test. Coworker with covid recently.  Low grade fever that night only. Some initial slight improvement after a few days. Nasal congestion, head congestion few days, primarily fatigue. Slight dizziness. No neck stiffness, mild HA.  Still fatigued, naps during day. Advil for lower body aches. Nyquil once  Increased appetite yesterday. [place] food last night, did not sit well - vomited about 5 hrs later. No diarrhea, no further vomiting today - still some nausea. Drinking fluids, pedialyte, popsickle, yogurt and saltines today.  Congestion is much better. Dry nasal passages this am.  Cough, sneeze, respiratory symptoms better. No chest pain, no confusion, no dyspnea.  Dry mouth past week. Similar sx's about a month ago. Increased urination past week, no dysuria, hematuria, urgency, abd pain or  recent fever. Temp 99 about 4 days ago. Advil today.  No hx of hyperglycemia, prediabetes.  No blurry vision.  This is first time having covid infection.  LMP normal 2-3 weeks.   Intentional 25# weight loss since February. Diet, yoga and intentional changes.   Immunization History  Administered Date(s) Administered   PFIZER(Purple Top)SARS-COV-2 Vaccination 12/03/2019   Pfizer Covid-19 Vaccine Bivalent Booster 75yrs & up 11/08/2020   Td 01/22/2007   Tdap 12/02/2018  Covid booster     Patient Active Problem List   Diagnosis Date Noted   Genetic testing 05/31/2020   Family history of prostate cancer    Family history of gene mutation    Family history of breast cancer    GERD 01/20/2008   ECZEMA 01/20/2008   ELEVATED BLOOD PRESSURE WITHOUT DIAGNOSIS OF HYPERTENSION 01/20/2008   ACL TEAR 07/10/2006   Past Medical History:  Diagnosis Date   Family history of breast cancer    Family history of gene mutation    Family history of prostate cancer    Past Surgical History:  Procedure Laterality Date   BREAST CYST EXCISION Right    fatty tumor 2002   Allergies  Allergen Reactions   Tylenol [Acetaminophen] Palpitations   Metronidazole    Other    Prior to Admission medications   Not on File   Social History   Socioeconomic History   Marital status: Single    Spouse name: Not on file   Number of children: Not on file   Years of education: Not on file   Highest education level: Not on file  Occupational History  Not on file  Tobacco Use   Smoking status: Never   Smokeless tobacco: Never  Substance and Sexual Activity   Alcohol use: Yes    Alcohol/week: 2.0 standard drinks of alcohol    Types: 2 Glasses of wine per week   Drug use: Never   Sexual activity: Yes  Other Topics Concern   Not on file  Social History Narrative   Not on file   Social Determinants of Health   Financial Resource Strain: Not on file  Food Insecurity: Not on file  Transportation  Needs: Not on file  Physical Activity: Not on file  Stress: Not on file  Social Connections: Not on file  Intimate Partner Violence: Not on file    Observations/Objective: There were no vitals filed for this visit. Nontoxic appearance on video.  Speaking in full sentences, no respiratory distress.  Coherent responses, appropriate responses, all questions answered with understanding of plan expressed.  Assessment and Plan: COVID-19 virus infection  Nausea and vomiting, unspecified vomiting type - Plan: Comprehensive metabolic panel  Urinary frequency - Plan: POCT urinalysis dipstick, Urine Microscopic, POCT glucose (manual entry), POCT glycosylated hemoglobin (Hb A1C)  Increased thirst - Plan: Comprehensive metabolic panel, POCT glucose (manual entry)  Weight loss  COVID-19 infection, now day 8.  Respiratory symptoms improving, myalgias and fatigue still persistent but some improvement.  No chest pain, dyspnea or red flags regards to COVID symptoms.  Continue symptomatic care, fluids, rest, masking precautions per CDC for next few days.  Episode of vomiting and nausea may have been related to COVID infection versus foodborne illness.  Bland diet recommended with slow increase in foods, fluids most important and she is hydrating effectively.  RTC/ER precautions.  We will check CMP on lab visit.  Urinary frequency, increased thirst, intentional weight loss.  Thirst, urinary symptoms may be related to current illness.  Less likely urinary tract infection, but in differential.  Unlikely diabetes/prediabetes but did have episode of increased thirst prior to COVID illness. Check point-of-care glucose, A1c, urinalysis, CMP.    09/17/21 addendum - noted ER visit last night, after hyperglycemia noted at home.  Evaluated for diabetic ketoacidosis.  Suspect recent onset diabetes with progression to DKA/worsening with recent COVID infection.  Last blood work at her physical in October 2020 with normal  glucose at that time.  Pended labs for lab visit today were canceled.  Follow Up Instructions: Lab visit 1 day, RTC precautions.   I discussed the assessment and treatment plan with the patient. The patient was provided an opportunity to ask questions and all were answered. The patient agreed with the plan and demonstrated an understanding of the instructions.   The patient was advised to call back or seek an in-person evaluation if the symptoms worsen or if the condition fails to improve as anticipated.   Shade Flood, MD

## 2021-09-16 NOTE — Patient Instructions (Signed)
Lab visit tomorrow.

## 2021-09-17 ENCOUNTER — Emergency Department (HOSPITAL_COMMUNITY): Payer: No Typology Code available for payment source

## 2021-09-17 ENCOUNTER — Other Ambulatory Visit: Payer: Self-pay

## 2021-09-17 ENCOUNTER — Encounter (HOSPITAL_COMMUNITY): Payer: Self-pay | Admitting: Emergency Medicine

## 2021-09-17 ENCOUNTER — Encounter: Payer: Self-pay | Admitting: Family Medicine

## 2021-09-17 ENCOUNTER — Inpatient Hospital Stay (HOSPITAL_COMMUNITY)
Admission: EM | Admit: 2021-09-17 | Discharge: 2021-09-18 | DRG: 637 | Disposition: A | Discharge status: Home / Self Care | Payer: No Typology Code available for payment source | Attending: Student | Admitting: Student

## 2021-09-17 DIAGNOSIS — Z888 Allergy status to other drugs, medicaments and biological substances status: Secondary | ICD-10-CM

## 2021-09-17 DIAGNOSIS — E1010 Type 1 diabetes mellitus with ketoacidosis without coma: Secondary | ICD-10-CM | POA: Diagnosis present

## 2021-09-17 DIAGNOSIS — Z6826 Body mass index (BMI) 26.0-26.9, adult: Secondary | ICD-10-CM

## 2021-09-17 DIAGNOSIS — U071 COVID-19: Secondary | ICD-10-CM

## 2021-09-17 DIAGNOSIS — E663 Overweight: Secondary | ICD-10-CM | POA: Diagnosis present

## 2021-09-17 DIAGNOSIS — E109 Type 1 diabetes mellitus without complications: Secondary | ICD-10-CM

## 2021-09-17 DIAGNOSIS — K219 Gastro-esophageal reflux disease without esophagitis: Secondary | ICD-10-CM | POA: Diagnosis present

## 2021-09-17 DIAGNOSIS — E876 Hypokalemia: Secondary | ICD-10-CM | POA: Diagnosis present

## 2021-09-17 DIAGNOSIS — R809 Proteinuria, unspecified: Secondary | ICD-10-CM | POA: Diagnosis present

## 2021-09-17 DIAGNOSIS — Z8249 Family history of ischemic heart disease and other diseases of the circulatory system: Secondary | ICD-10-CM | POA: Diagnosis not present

## 2021-09-17 DIAGNOSIS — R823 Hemoglobinuria: Secondary | ICD-10-CM | POA: Diagnosis present

## 2021-09-17 DIAGNOSIS — E1110 Type 2 diabetes mellitus with ketoacidosis without coma: Secondary | ICD-10-CM | POA: Diagnosis present

## 2021-09-17 DIAGNOSIS — Z886 Allergy status to analgesic agent status: Secondary | ICD-10-CM

## 2021-09-17 LAB — BASIC METABOLIC PANEL
Anion gap: 14 (ref 5–15)
Anion gap: 7 (ref 5–15)
Anion gap: 10 (ref 5–15)
Anion gap: 7 (ref 5–15)
BUN: 10 mg/dL (ref 6–20)
BUN: 8 mg/dL (ref 6–20)
BUN: 8 mg/dL (ref 6–20)
BUN: 8 mg/dL (ref 6–20)
CO2: 12 mmol/L — ABNORMAL LOW (ref 22–32)
CO2: 20 mmol/L — ABNORMAL LOW (ref 22–32)
CO2: 17 mmol/L — ABNORMAL LOW (ref 22–32)
CO2: 19 mmol/L — ABNORMAL LOW (ref 22–32)
Calcium: 8.4 mg/dL — ABNORMAL LOW (ref 8.9–10.3)
Calcium: 8.5 mg/dL — ABNORMAL LOW (ref 8.9–10.3)
Calcium: 8.8 mg/dL — ABNORMAL LOW (ref 8.9–10.3)
Calcium: 8.2 mg/dL — ABNORMAL LOW (ref 8.9–10.3)
Chloride: 112 mmol/L — ABNORMAL HIGH (ref 98–111)
Chloride: 113 mmol/L — ABNORMAL HIGH (ref 98–111)
Chloride: 112 mmol/L — ABNORMAL HIGH (ref 98–111)
Chloride: 110 mmol/L (ref 98–111)
Creatinine, Ser: 0.78 mg/dL (ref 0.44–1.00)
Creatinine, Ser: 0.59 mg/dL (ref 0.44–1.00)
Creatinine, Ser: 0.54 mg/dL (ref 0.44–1.00)
Creatinine, Ser: 0.57 mg/dL (ref 0.44–1.00)
GFR, Estimated: >60 mL/min (ref >60)
GFR, Estimated: >60 mL/min (ref >60)
GFR, Estimated: >60 mL/min (ref >60)
GFR, Estimated: >60 mL/min (ref >60)
Glucose, Bld: 274 mg/dL — ABNORMAL HIGH (ref 70–99)
Glucose, Bld: 190 mg/dL — ABNORMAL HIGH (ref 70–99)
Glucose, Bld: 161 mg/dL — ABNORMAL HIGH (ref 70–99)
Glucose, Bld: 233 mg/dL — ABNORMAL HIGH (ref 70–99)
Potassium: 2.8 mmol/L — ABNORMAL LOW (ref 3.5–5.1)
Potassium: 2.5 mmol/L — CL (ref 3.5–5.1)
Potassium: 2.9 mmol/L — ABNORMAL LOW (ref 3.5–5.1)
Potassium: 3.2 mmol/L — ABNORMAL LOW (ref 3.5–5.1)
Sodium: 138 mmol/L (ref 135–145)
Sodium: 140 mmol/L (ref 135–145)
Sodium: 139 mmol/L (ref 135–145)
Sodium: 136 mmol/L (ref 135–145)

## 2021-09-17 LAB — CBC WITH DIFFERENTIAL/PLATELET
Abs Immature Granulocytes: 0.14 10*3/uL — ABNORMAL HIGH (ref 0.00–0.07)
Basophils Absolute: 0 10*3/uL (ref 0.0–0.1)
Basophils Relative: 1 %
Eosinophils Absolute: 0 10*3/uL (ref 0.0–0.5)
Eosinophils Relative: 0 %
HCT: 44.7 % (ref 36.0–46.0)
Hemoglobin: 15.8 g/dL — ABNORMAL HIGH (ref 12.0–15.0)
Immature Granulocytes: 3 %
Lymphocytes Relative: 16 %
Lymphs Abs: 0.7 10*3/uL (ref 0.7–4.0)
MCH: 33.4 pg (ref 26.0–34.0)
MCHC: 35.3 g/dL (ref 30.0–36.0)
MCV: 94.5 fL (ref 80.0–100.0)
Monocytes Absolute: 0.4 10*3/uL (ref 0.1–1.0)
Monocytes Relative: 10 %
Neutro Abs: 3.3 10*3/uL (ref 1.7–7.7)
Neutrophils Relative %: 70 %
Platelets: 194 10*3/uL (ref 150–400)
RBC: 4.73 MIL/uL (ref 3.87–5.11)
RDW: 12.5 % (ref 11.5–15.5)
WBC: 4.6 10*3/uL (ref 4.0–10.5)
nRBC: 0 % (ref 0.0–0.2)

## 2021-09-17 LAB — CBG MONITORING, ED
Glucose-Capillary: 422 mg/dL — ABNORMAL HIGH (ref 70–99)
Glucose-Capillary: 389 mg/dL — ABNORMAL HIGH (ref 70–99)
Glucose-Capillary: 363 mg/dL — ABNORMAL HIGH (ref 70–99)
Glucose-Capillary: 268 mg/dL — ABNORMAL HIGH (ref 70–99)
Glucose-Capillary: 211 mg/dL — ABNORMAL HIGH (ref 70–99)
Glucose-Capillary: 218 mg/dL — ABNORMAL HIGH (ref 70–99)
Glucose-Capillary: 197 mg/dL — ABNORMAL HIGH (ref 70–99)
Glucose-Capillary: 190 mg/dL — ABNORMAL HIGH (ref 70–99)
Glucose-Capillary: 184 mg/dL — ABNORMAL HIGH (ref 70–99)
Glucose-Capillary: 169 mg/dL — ABNORMAL HIGH (ref 70–99)
Glucose-Capillary: 165 mg/dL — ABNORMAL HIGH (ref 70–99)

## 2021-09-17 LAB — URINALYSIS, ROUTINE W REFLEX MICROSCOPIC
Bacteria, UA: NONE SEEN
Bilirubin Urine: NEGATIVE
Glucose, UA: >=500 mg/dL — AB
Ketones, ur: 80 mg/dL — AB
Leukocytes,Ua: NEGATIVE
Nitrite: NEGATIVE
Protein, ur: 30 mg/dL — AB
Specific Gravity, Urine: 1.022 (ref 1.005–1.030)
pH: 5 (ref 5.0–8.0)

## 2021-09-17 LAB — GLUCOSE, CAPILLARY
Glucose-Capillary: 166 mg/dL — ABNORMAL HIGH (ref 70–99)
Glucose-Capillary: 167 mg/dL — ABNORMAL HIGH (ref 70–99)
Glucose-Capillary: 271 mg/dL — ABNORMAL HIGH (ref 70–99)

## 2021-09-17 LAB — BLOOD GAS, VENOUS
Acid-base deficit: 21.1 mmol/L — ABNORMAL HIGH (ref 0.0–2.0)
Bicarbonate: 6.8 mmol/L — ABNORMAL LOW (ref 20.0–28.0)
O2 Saturation: 91.6 %
Patient temperature: 37
pCO2, Ven: 22 mmHg — ABNORMAL LOW (ref 44–60)
pH, Ven: 7.1 — CL (ref 7.25–7.43)
pO2, Ven: 63 mmHg — ABNORMAL HIGH (ref 32–45)

## 2021-09-17 LAB — COMPREHENSIVE METABOLIC PANEL
ALT: 16 U/L (ref 0–44)
AST: 15 U/L (ref 15–41)
Albumin: 4 g/dL (ref 3.5–5.0)
Alkaline Phosphatase: 65 U/L (ref 38–126)
Anion gap: 20 — ABNORMAL HIGH (ref 5–15)
BUN: 12 mg/dL (ref 6–20)
CO2: 8 mmol/L — ABNORMAL LOW (ref 22–32)
Calcium: 8.8 mg/dL — ABNORMAL LOW (ref 8.9–10.3)
Chloride: 106 mmol/L (ref 98–111)
Creatinine, Ser: 0.87 mg/dL (ref 0.44–1.00)
GFR, Estimated: >60 mL/min (ref >60)
Glucose, Bld: 425 mg/dL — ABNORMAL HIGH (ref 70–99)
Potassium: 3.7 mmol/L (ref 3.5–5.1)
Sodium: 134 mmol/L — ABNORMAL LOW (ref 135–145)
Total Bilirubin: 1.4 mg/dL — ABNORMAL HIGH (ref 0.3–1.2)
Total Protein: 6.8 g/dL (ref 6.5–8.1)

## 2021-09-17 LAB — MRSA NEXT GEN BY PCR, NASAL: MRSA by PCR Next Gen: NOT DETECTED

## 2021-09-17 LAB — I-STAT BETA HCG BLOOD, ED (MC, WL, AP ONLY): I-stat hCG, quantitative: <5 m[IU]/mL (ref <5)

## 2021-09-17 LAB — MAGNESIUM: Magnesium: 1.9 mg/dL (ref 1.7–2.4)

## 2021-09-17 LAB — HEMOGLOBIN A1C
Hgb A1c MFr Bld: 12 % — ABNORMAL HIGH (ref 4.8–5.6)
Mean Plasma Glucose: 297.7 mg/dL

## 2021-09-17 LAB — BETA-HYDROXYBUTYRIC ACID: Beta-Hydroxybutyric Acid: >8 mmol/L — ABNORMAL HIGH (ref 0.05–0.27)

## 2021-09-17 MED ORDER — DEXTROSE IN LACTATED RINGERS 5 % IV SOLN: INTRAVENOUS | Status: DC

## 2021-09-17 MED ORDER — LACTATED RINGERS IV SOLN: INTRAVENOUS | Status: AC

## 2021-09-17 MED ORDER — LACTATED RINGERS IV BOLUS: 20.0000 mL/kg | Freq: Once | INTRAVENOUS | Status: DC

## 2021-09-17 MED ORDER — POTASSIUM CHLORIDE 10 MEQ/100ML IV SOLN
10.0000 meq | INTRAVENOUS | Status: AC
  Administered 2021-09-17: 10 meq via INTRAVENOUS
  Filled 2021-09-17: qty 100

## 2021-09-17 MED ORDER — ONDANSETRON HCL 4 MG/2ML IJ SOLN: 4.0000 mg | Freq: Four times a day (QID) | INTRAMUSCULAR | Status: DC | PRN

## 2021-09-17 MED ORDER — INSULIN GLARGINE-YFGN 100 UNIT/ML ~~LOC~~ SOLN
7.0000 [IU] | Freq: Every day | SUBCUTANEOUS | Status: DC
  Administered 2021-09-17: 7 [IU] via SUBCUTANEOUS
  Filled 2021-09-17 (×2): qty 0.07

## 2021-09-17 MED ORDER — CHLORHEXIDINE GLUCONATE CLOTH 2 % EX PADS
6.0000 | MEDICATED_PAD | Freq: Every day | CUTANEOUS | Status: DC
  Administered 2021-09-17: 6 via TOPICAL

## 2021-09-17 MED ORDER — KETOROLAC TROMETHAMINE 15 MG/ML IJ SOLN
15.0000 mg | Freq: Once | INTRAMUSCULAR | Status: AC
  Administered 2021-09-17: 15 mg via INTRAVENOUS
  Filled 2021-09-17: qty 1

## 2021-09-17 MED ORDER — INSULIN ASPART 100 UNIT/ML IJ SOLN
0.0000 [IU] | Freq: Three times a day (TID) | INTRAMUSCULAR | Status: DC
  Administered 2021-09-17: 3 [IU] via SUBCUTANEOUS
  Administered 2021-09-18: 8 [IU] via SUBCUTANEOUS

## 2021-09-17 MED ORDER — POTASSIUM CHLORIDE 10 MEQ/100ML IV SOLN
10.0000 meq | INTRAVENOUS | Status: AC
  Administered 2021-09-17 (×4): 10 meq via INTRAVENOUS
  Filled 2021-09-17 (×4): qty 100

## 2021-09-17 MED ORDER — INSULIN REGULAR(HUMAN) IN NACL 100-0.9 UT/100ML-% IV SOLN
INTRAVENOUS | Status: DC
  Administered 2021-09-17: 9.5 [IU]/h via INTRAVENOUS
  Filled 2021-09-17: qty 100

## 2021-09-17 MED ORDER — POTASSIUM CHLORIDE 20 MEQ PO PACK
40.0000 meq | PACK | Freq: Once | ORAL | Status: AC
  Administered 2021-09-17: 40 meq via ORAL
  Filled 2021-09-17: qty 2

## 2021-09-17 MED ORDER — DEXTROSE 50 % IV SOLN: 0.0000 mL | INTRAVENOUS | Status: DC | PRN

## 2021-09-17 MED ORDER — FAMOTIDINE 20 MG PO TABS
20.0000 mg | ORAL_TABLET | Freq: Two times a day (BID) | ORAL | Status: DC
  Administered 2021-09-17 – 2021-09-18 (×2): 20 mg via ORAL
  Filled 2021-09-17 (×2): qty 1

## 2021-09-17 MED ORDER — POTASSIUM CHLORIDE CRYS ER 20 MEQ PO TBCR
40.0000 meq | EXTENDED_RELEASE_TABLET | Freq: Once | ORAL | Status: DC
Start: 2021-09-17 — End: 2021-09-17

## 2021-09-17 MED ORDER — LACTATED RINGERS IV SOLN
INTRAVENOUS | Status: DC
Start: 2021-09-17 — End: 2021-09-17

## 2021-09-17 MED ORDER — INSULIN STARTER KIT- PEN NEEDLES (ENGLISH)
1.0000 | Freq: Once | Status: AC
  Administered 2021-09-17: 1
  Filled 2021-09-17: qty 1

## 2021-09-17 MED ORDER — LACTATED RINGERS IV BOLUS
1000.0000 mL | INTRAVENOUS | Status: AC
  Administered 2021-09-17 (×2): 1000 mL via INTRAVENOUS

## 2021-09-17 MED ORDER — MAGNESIUM SULFATE 2 GM/50ML IV SOLN
2.0000 g | Freq: Once | INTRAVENOUS | Status: AC
  Administered 2021-09-17: 2 g via INTRAVENOUS
  Filled 2021-09-17: qty 50

## 2021-09-17 MED ORDER — ORAL CARE MOUTH RINSE: 15.0000 mL | OROMUCOSAL | Status: DC | PRN

## 2021-09-17 MED ORDER — LIVING WELL WITH DIABETES BOOK
Freq: Once | Status: AC
  Filled 2021-09-17: qty 1

## 2021-09-17 NOTE — Progress Notes (Signed)
  Carryover admission to the Day Admitter.  I discussed this case with the EDP, Dr.Palumbo.  Per these discussions:   This is a 46 year old female with no known history of diabetes, who is being admitted with DKA after presenting with 7-8 days of fever, rhinitis, rhinorrhea, nausea, vomiting polyuria, polydipsia.  COVID-19 positive.  Has not been on any antivirals or recent steroids.   Presenting CMP showed low bicarbonate with anion gap 20, while VBG reflects pH of 7.1 and is consistent with metabolic acidosis.  Urinalysis demonstrates 80 ketones.  Beta hydroxybutyric acid is currently pending.  Initial blood sugar 500.  Started on insulin drip with Endo tool.   O2 sats high 90's to 100 on RA.  Appears outside of window for antiviral therapy as relates to her COVID-19 diagnosis.  I have placed an order for inpatient admission to stepdown unit.  I have placed some additional preliminary admit orders via the adult multi-morbid admission order set. Will continue insulin drip on Endo tool.  Every 4 hours BMPs ordered.  I have also ordered every hour CBG monitoring and add on serum magnesium level.  Continued DKA protocol, including associated IV fluids.  X-ray ordered, with result currently pending.    Newton Pigg, DO Hospitalist

## 2021-09-17 NOTE — Addendum Note (Signed)
Addended by: Meredith Staggers R on: 09/17/2021 11:26 AM   Modules accepted: Orders

## 2021-09-17 NOTE — ED Provider Notes (Signed)
[facility]-EMERGENCY DEPT Provider Note   CSN: [id number] Arrival date & time: 09/17/21  0116     History  Chief Complaint  Patient presents with   Hyperglycemia    Angelica Martinez is a 46 y.o. female.  The history is provided by the patient.  Hyperglycemia Severity:  Severe Onset quality:  Gradual Duration:  1 week Timing:  Constant Progression:  Worsening Chronicity:  New Diabetes status:  Non-diabetic Current diabetic therapy:  500 Context: recent illness   Relieved by:  Nothing Ineffective treatments:  None tried Associated symptoms: increased thirst, nausea, polyuria and vomiting   Associated symptoms: no confusion, no diaphoresis and no weight change   Associated symptoms comment:  Weight loss and dry mouth  Risk factors: no pregnancy   Patient without DM presents with 1 week of dry mouth polyuria and nausea and vomiting.  Currently has covid 19.  States had an episode similar that lasted only a weeked 3 weeks ago.       Home Medications Prior to Admission medications   Medication Sig Start Date End Date Taking? Authorizing Provider  Ibuprofen-Acetaminophen (ADVIL DUAL ACTION PO) Take 1 tablet by mouth every 6 (six) hours as needed (pain).   Yes [provider]      Allergies    Tylenol [acetaminophen], Metronidazole, and Other    Review of Systems   Review of Systems  Constitutional:  Positive for unexpected weight change. Negative for diaphoresis.  HENT:  Negative for facial swelling.   Gastrointestinal:  Positive for nausea and vomiting.  Endocrine: Positive for polydipsia and polyuria.  Psychiatric/Behavioral:  Negative for confusion.   All other systems reviewed and are negative.   Physical Exam Updated Vital Signs BP (!) 148/91   Pulse 88   Temp 98 F (36.7 C) (Oral)   Resp (!) 23   Ht 5\' 3"  (1.6 m)   Wt 67.1 kg   LMP 09/02/2021 (Approximate)   SpO2 100%   BMI 26.22 kg/m  Physical Exam Vitals and  nursing note reviewed. Exam conducted with a chaperone present.  Constitutional:      General: She is not in acute distress.    Appearance: She is well-developed.  HENT:     Head: Normocephalic and atraumatic.     Nose: Nose normal.  Eyes:     Pupils: Pupils are equal, round, and reactive to light.  Cardiovascular:     Rate and Rhythm: Regular rhythm. Tachycardia present.     Pulses: Normal pulses.     Heart sounds: Normal heart sounds.  Pulmonary:     Effort: Pulmonary effort is normal. No respiratory distress.     Breath sounds: Normal breath sounds.  Abdominal:     General: Abdomen is flat. Bowel sounds are normal. There is no distension.     Palpations: Abdomen is soft.     Tenderness: There is no abdominal tenderness. There is no guarding or rebound.  Genitourinary:    Vagina: No vaginal discharge.  Musculoskeletal:        General: Normal range of motion.     Cervical back: Neck supple.  Skin:    General: Skin is warm and dry.     Capillary Refill: Capillary refill takes less than 2 seconds.     Findings: No erythema or rash.  Neurological:     General: No focal deficit present.     Mental Status: She is oriented to person, place, and time.     Deep Tendon  Reflexes: Reflexes normal.  Psychiatric:        Mood and Affect: Mood normal.        Behavior: Behavior normal.     ED Results / Procedures / Treatments   Labs (all labs ordered are listed, but only abnormal results are displayed) Results for orders placed or performed during the hospital encounter of 09/17/21  CBC with Differential  Result Value Ref Range   WBC 4.6 4.0 - 10.5 K/uL   RBC 4.73 3.87 - 5.11 MIL/uL   Hemoglobin 15.8 (H) 12.0 - 15.0 g/dL   HCT 95.1 88.4 - 16.6 %   MCV 94.5 80.0 - 100.0 fL   MCH 33.4 26.0 - 34.0 pg   MCHC 35.3 30.0 - 36.0 g/dL   RDW 06.3 01.6 - 01.0 %   Platelets 194 150 - 400 K/uL   nRBC 0.0 0.0 - 0.2 %   Neutrophils Relative % 70 %   Neutro Abs 3.3 1.7 - 7.7 K/uL    Lymphocytes Relative 16 %   Lymphs Abs 0.7 0.7 - 4.0 K/uL   Monocytes Relative 10 %   Monocytes Absolute 0.4 0.1 - 1.0 K/uL   Eosinophils Relative 0 %   Eosinophils Absolute 0.0 0.0 - 0.5 K/uL   Basophils Relative 1 %   Basophils Absolute 0.0 0.0 - 0.1 K/uL   Immature Granulocytes 3 %   Abs Immature Granulocytes 0.14 (H) 0.00 - 0.07 K/uL  Comprehensive metabolic panel  Result Value Ref Range   Sodium 134 (L) 135 - 145 mmol/L   Potassium 3.7 3.5 - 5.1 mmol/L   Chloride 106 98 - 111 mmol/L   CO2 8 (L) 22 - 32 mmol/L   Glucose, Bld 425 (H) 70 - 99 mg/dL   BUN 12 6 - 20 mg/dL   Creatinine, Ser 9.32 0.44 - 1.00 mg/dL   Calcium 8.8 (L) 8.9 - 10.3 mg/dL   Total Protein 6.8 6.5 - 8.1 g/dL   Albumin 4.0 3.5 - 5.0 g/dL   AST 15 15 - 41 U/L   ALT 16 0 - 44 U/L   Alkaline Phosphatase 65 38 - 126 U/L   Total Bilirubin 1.4 (H) 0.3 - 1.2 mg/dL   GFR, Estimated >35 >57 mL/min   Anion gap 20 (H) 5 - 15  Urinalysis, Routine w reflex microscopic  Result Value Ref Range   Color, Urine STRAW (A) YELLOW   APPearance CLEAR CLEAR   Specific Gravity, Urine 1.022 1.005 - 1.030   pH 5.0 5.0 - 8.0   Glucose, UA >=500 (A) NEGATIVE mg/dL   Hgb urine dipstick MODERATE (A) NEGATIVE   Bilirubin Urine NEGATIVE NEGATIVE   Ketones, ur 80 (A) NEGATIVE mg/dL   Protein, ur 30 (A) NEGATIVE mg/dL   Nitrite NEGATIVE NEGATIVE   Leukocytes,Ua NEGATIVE NEGATIVE   RBC / HPF 0-5 0 - 5 RBC/hpf   WBC, UA 0-5 0 - 5 WBC/hpf   Bacteria, UA NONE SEEN NONE SEEN   Squamous Epithelial / LPF 0-5 0 - 5   Mucus PRESENT    Hyaline Casts, UA PRESENT   Blood gas, venous  Result Value Ref Range   pH, Ven 7.1 (LL) 7.25 - 7.43   pCO2, Ven 22 (L) 44 - 60 mmHg   pO2, Ven 63 (H) 32 - 45 mmHg   Bicarbonate 6.8 (L) 20.0 - 28.0 mmol/L   Acid-base deficit 21.1 (H) 0.0 - 2.0 mmol/L   O2 Saturation 91.6 %   Patient temperature 37.0  CBG monitoring, ED  Result Value Ref Range   Glucose-Capillary 422 (H) 70 - 99 mg/dL  I-Stat beta  hCG blood, ED  Result Value Ref Range   I-stat hCG, quantitative <5.0 <5 mIU/mL   Comment 3          CBG monitoring, ED  Result Value Ref Range   Glucose-Capillary 389 (H) 70 - 99 mg/dL   MM 3D SCREEN BREAST BILATERAL  Result Date: 08/23/2021 CLINICAL DATA:  Screening. EXAM: DIGITAL SCREENING BILATERAL MAMMOGRAM WITH TOMOSYNTHESIS AND CAD TECHNIQUE: Bilateral screening digital craniocaudal and mediolateral oblique mammograms were obtained. Bilateral screening digital breast tomosynthesis was performed. The images were evaluated with computer-aided detection. COMPARISON:  Previous exam(s). ACR Breast Density Category c: The breast tissue is heterogeneously dense, which may obscure small masses. FINDINGS: There are no findings suspicious for malignancy. IMPRESSION: No mammographic evidence of malignancy. A result letter of this screening mammogram will be mailed directly to the patient. RECOMMENDATION: Screening mammogram in one year. (Code:SM-B-01Y) BI-RADS CATEGORY  1: Negative. Electronically Signed   By: Annia Belt M.D.   On: 08/23/2021 09:26     Radiology No results found.  Procedures Procedures    Medications Ordered in ED Medications  insulin regular, human (MYXREDLIN) 100 units/ 100 mL infusion (9.5 Units/hr Intravenous New Bag/Given 09/17/21 0255)  lactated ringers infusion ( Intravenous New Bag/Given 09/17/21 0247)  dextrose 5 % in lactated ringers infusion (has no administration in time range)  dextrose 50 % solution 0-50 mL (has no administration in time range)  lactated ringers bolus 1,000 mL (1,000 mLs Intravenous New Bag/Given 09/17/21 0246)    ED Course/ Medical Decision Making/ A&P                           Medical Decision Making Patient with elevated glucose at home and polyuria and dipsia and vomiting   Amount and/or Complexity of Data Reviewed Independent Historian: EMS    Details: see above External Data Reviewed: notes.    Details: previous notes reviewed Labs:  ordered.    Details: all labs reviewed:  normal white count, hemoglobin 15.8 elevated.  Normal potassium low CO2 8 elevated aniona gap of 20 glucose elevated 422, elevated bilirubin 1.4.  No UTI but > 80 ketones in urin.  Ph 7.1 acidotic all consistent with DKA  Risk Prescription drug management. Drug therapy requiring intensive monitoring for toxicity. Decision regarding hospitalization. Risk Details: The patient appears reasonably stabilized for admission considering the current resources, flow, and capabilities available in the ED at this time, and I doubt any other [facility] requiring further screening and/or treatment in the ED prior to admission.   Critical Care Total time providing critical care: 60 minutes (Multiple boluses and IV insulin ordered )    Final Clinical Impression(s) / ED Diagnoses Final diagnoses:  Diabetic ketoacidosis without coma associated with type 1 diabetes mellitus (HCC)  COVID-19        Chidera Thivierge, MD 09/17/21 [id number]

## 2021-09-17 NOTE — H&P (Signed)
History and Physical    Patient: Angelica Martinez DOB: 1975/12/18 DOA: 09/17/2021 DOS: the patient was seen and examined on 09/17/2021 PCP: Wendie Agreste, MD  Patient coming from: Home  Chief Complaint:  Chief Complaint  Patient presents with   Hyperglycemia   HPI: Angelica Martinez is a 46 y.o. female with medical history significant of GERD who is coming to the emergency department due to hyperglycemia more than 500 mg/dL at home on CBG check.  She has been battling COVID for the past week, but is now over the symptoms.  However, she has also been having polyuria and polydipsia as well.  No fever, chills or night sweats. No dyspnea, wheezing or hemoptysis.  No chest pain, palpitations, diaphoresis, PND, orthopnea or pitting edema of the lower extremities.  No appetite changes, abdominal pain, diarrhea, constipation, melena or hematochezia.  No flank pain, dysuria, frequency or hematuria.   ED course: Initial vital signs were temperature 98 F, pulse 104, respiration 20, BP 146/110 mmHg O2 sat 100% on room air.  The patient received LR bolus, electrolyte replacement and was started on an insulin infusion.  Lab work: Urinalysis showed glucosuria more than 500, moderate hemoglobinuria, ketonuria of 80 and proteinuria 30 mg/dL.  CBC showed a white count of 4.6, hemoglobin 15.8 g/dL platelets 194.  CMP showed CO2 of 8 mmol/L with an anion gap of 20.  Glucose 425, calcium is 8.9 and total bilirubin 1.4 mg/dL.  Normal sodium when corrected.  VBG showed a pH of 7.1 with a PCO2 of 22 PCO2 63 mmHg.  Beta hydroxybutyric acid was more than 8.0 mmol/L.  Imaging: 1 view chest radiograph with no active disease.   Review of Systems: As mentioned in the history of present illness. All other systems reviewed and are negative. Past Medical History:  Diagnosis Date   Family history of breast cancer    Family history of gene mutation    Family history of prostate cancer    Past Surgical  History:  Procedure Laterality Date   BREAST CYST EXCISION Right    fatty tumor 2002   Social History:  reports that she has never smoked. She has never used smokeless tobacco. She reports current alcohol use of about 2.0 standard drinks of alcohol per week. She reports that she does not use drugs.  Allergies  Allergen Reactions   Tylenol [Acetaminophen] Palpitations   Metronidazole    Other     Family History  Problem Relation Age of Onset   Hypertension Mother    Arthritis Mother    Other Mother        genetic test - CHEK2 positive   Arthritis Father    Breast cancer Sister 32       bilateral mastectomies   Other Sister        genetic test - CHEK2 positive (c.[id number])   Prostate cancer Paternal Grandfather    Other Sister        genetic test - CHEK2 negative   Breast cancer Maternal Great-grandmother        MGF's mother    Prior to Admission medications   Medication Sig Start Date End Date Taking? Authorizing Provider  Ibuprofen-Acetaminophen (ADVIL DUAL ACTION PO) Take 1 tablet by mouth every 6 (six) hours as needed (pain).   Yes [provider]    Physical Exam: Vitals:   09/17/21 0300 09/17/21 0345 09/17/21 0605 09/17/21 0630  BP: (!) 148/91 (!) 141/84  129/86  Pulse: 88 92  88  Resp: (!) 23 16  (!) 21  Temp:   97.9 F (36.6 C)   TempSrc:   Oral   SpO2: 100% 100%  99%  Weight:      Height:       Physical Exam Vitals and nursing note reviewed.  Constitutional:      General: She is awake.     Appearance: Normal appearance. She is overweight.  HENT:     Head: Normocephalic.     Mouth/Throat:     Mouth: Mucous membranes are dry.  Eyes:     General: No scleral icterus.    Pupils: Pupils are equal, round, and reactive to light.  Neck:     Vascular: No JVD.  Cardiovascular:     Rate and Rhythm: Normal rate and regular rhythm.     Heart sounds: S1 normal and S2 normal.  Pulmonary:     Breath sounds: Normal breath sounds. No wheezing, rhonchi  or rales.  Abdominal:     General: Bowel sounds are normal. There is no distension.     Palpations: Abdomen is soft.     Tenderness: There is no abdominal tenderness. There is no right CVA tenderness, left CVA tenderness or guarding.  Musculoskeletal:     Right lower leg: No edema.     Left lower leg: No edema.  Skin:    General: Skin is warm and dry.  Neurological:     General: No focal deficit present.     Mental Status: She is alert and oriented to person, place, and time.  Psychiatric:        Mood and Affect: Mood normal.        Behavior: Behavior normal. Behavior is cooperative.   Data Reviewed:  There are no new results to review at this time.  Assessment and Plan: Principal Problem:   DKA (diabetic ketoacidosis) ([place]) Observation/stepdown. Keep NPO. Continue IV fluids. Continue insulin infusion. Monitor CBG closely. BMP every 4 hours. BHA every 8 hours. Consult diabetes coordinator. Transition to SQ insulin per Endo tool. C-peptide, insulin and GTA antibodies pending.  Active Problems:   GERD Famotidine 20 mg p.o. twice daily while in the hospital.    Hypokalemia Replacing. Follow-up potassium level.    Advance Care Planning:   Code Status: Full Code   Consults:   Family Communication:   Severity of Illness: The appropriate patient status for this patient is INPATIENT. Inpatient status is judged to be reasonable and necessary in order to provide the required intensity of service to ensure the patient's safety. The patient's presenting symptoms, physical exam findings, and initial radiographic and laboratory data in the context of their chronic comorbidities is felt to place them at high risk for further clinical deterioration. Furthermore, it is not anticipated that the patient will be medically stable for discharge from the hospital within 2 midnights of admission.   * I certify that at the point of admission it is my clinical judgment that the patient  will require inpatient hospital care spanning beyond 2 midnights from the point of admission due to high intensity of service, high risk for further deterioration and high frequency of surveillance required.*  Author: Reubin Milan, MD 09/17/2021 8:01 AM  For on call review [email or web address].   This document was prepared using Dragon voice recognition software and may contain some unintended transcription errors.

## 2021-09-17 NOTE — Progress Notes (Addendum)
Inpatient Diabetes Program Recommendations  AACE/ADA: New Consensus Statement on Inpatient Glycemic Control (2015)  Target Ranges:  Prepandial:   less than 140 mg/dL      Peak postprandial:   less than 180 mg/dL (1-2 hours)      Critically ill patients:  140 - 180 mg/dL   Lab Results  Component Value Date   GLUCAP 190 (H) 09/17/2021    Review of Glycemic Control  Latest Reference Range & Units 09/17/21 02:48 09/17/21 03:37 09/17/21 03:54 09/17/21 04:36  Glucose-Capillary 70 - 99 mg/dL 774 (H) 128 (H) 786 (H) 190 (H)   Diabetes history: New diagnosis of DM Outpatient Diabetes medications:  None Current orders for Inpatient glycemic control:  IV insulin/DKA order set Inpatient Diabetes Program Recommendations:   Referral received.  Note that patient on IV insulin due to new DKA.  Recommend adding insulin antibodies and c-peptide to determine if this may be Type 1 DM? Will follow patient.   Thanks,  Beryl Meager, RN, BC-ADM Inpatient Diabetes Coordinator Pager [phone]  (8a-5p)  Addendum:  Spoke briefly to patient regarding new diagnosis of DM.  Discussed labs being drawn and the difference between Type 1 and Type 2 DM.  Patient states that her sisters are in the waiting room and that she has a nephew with Type 1 DM so they are very familiar with management. Briefly spoke to her sisters and they report that patient has had significant weight loss since February of 2023.  Will follow closely, assure that patient knows survival skills of DM management, etc.  Needs endocrinologist to follow DM as well.

## 2021-09-17 NOTE — ED Triage Notes (Signed)
Patient BIB [facility] c/o hyperglycemia.  Patient is covid positive.  CBG 500 at home, no history of diabetes.   EMS CBG 441 500 mL NaCl 80 HR 20 RR 155/96 98% 18 L AC

## 2021-09-18 ENCOUNTER — Other Ambulatory Visit (HOSPITAL_COMMUNITY): Payer: Self-pay

## 2021-09-18 DIAGNOSIS — E1010 Type 1 diabetes mellitus with ketoacidosis without coma: Secondary | ICD-10-CM | POA: Diagnosis not present

## 2021-09-18 DIAGNOSIS — U071 COVID-19: Secondary | ICD-10-CM

## 2021-09-18 DIAGNOSIS — K219 Gastro-esophageal reflux disease without esophagitis: Secondary | ICD-10-CM | POA: Diagnosis not present

## 2021-09-18 DIAGNOSIS — E109 Type 1 diabetes mellitus without complications: Secondary | ICD-10-CM

## 2021-09-18 LAB — GLUCOSE, CAPILLARY
Glucose-Capillary: 259 mg/dL — ABNORMAL HIGH (ref 70–99)
Glucose-Capillary: 257 mg/dL — ABNORMAL HIGH (ref 70–99)
Glucose-Capillary: 309 mg/dL — ABNORMAL HIGH (ref 70–99)
Glucose-Capillary: 239 mg/dL — ABNORMAL HIGH (ref 70–99)

## 2021-09-18 LAB — CBC
HCT: 36.7 % (ref 36.0–46.0)
Hemoglobin: 13.1 g/dL (ref 12.0–15.0)
MCH: 33.6 pg (ref 26.0–34.0)
MCHC: 35.7 g/dL (ref 30.0–36.0)
MCV: 94.1 fL (ref 80.0–100.0)
Platelets: 134 10*3/uL — ABNORMAL LOW (ref 150–400)
RBC: 3.9 MIL/uL (ref 3.87–5.11)
RDW: 12.9 % (ref 11.5–15.5)
WBC: 2.9 10*3/uL — ABNORMAL LOW (ref 4.0–10.5)
nRBC: 0 % (ref 0.0–0.2)

## 2021-09-18 LAB — MAGNESIUM: Magnesium: 1.8 mg/dL (ref 1.7–2.4)

## 2021-09-18 LAB — C-PEPTIDE: C-Peptide: 0.3 ng/mL — ABNORMAL LOW (ref 1.1–4.4)

## 2021-09-18 MED ORDER — ONDANSETRON HCL 4 MG PO TABS: 4.0000 mg | ORAL_TABLET | Freq: Four times a day (QID) | ORAL | 0 refills | Status: AC | PRN

## 2021-09-18 MED ORDER — K PHOS MONO-SOD PHOS DI & MONO 155-852-130 MG PO TABS: 500.0000 mg | ORAL_TABLET | Freq: Three times a day (TID) | ORAL | 0 refills | Status: DC

## 2021-09-18 MED ORDER — INSULIN GLARGINE-YFGN 100 UNIT/ML ~~LOC~~ SOLN
15.0000 [IU] | Freq: Two times a day (BID) | SUBCUTANEOUS | Status: DC
  Administered 2021-09-18: 15 [IU] via SUBCUTANEOUS
  Filled 2021-09-18 (×2): qty 0.15

## 2021-09-18 MED ORDER — BASAGLAR KWIKPEN 100 UNIT/ML ~~LOC~~ SOPN: 25.0000 [IU] | PEN_INJECTOR | Freq: Every day | SUBCUTANEOUS | 1 refills | Status: DC

## 2021-09-18 MED ORDER — INSULIN LISPRO (1 UNIT DIAL) 100 UNIT/ML (KWIKPEN): PEN_INJECTOR | SUBCUTANEOUS | 11 refills | Status: DC

## 2021-09-18 MED ORDER — INSULIN ASPART 100 UNIT/ML IJ SOLN
4.0000 [IU] | Freq: Three times a day (TID) | INTRAMUSCULAR | Status: DC
  Administered 2021-09-18: 4 [IU] via SUBCUTANEOUS

## 2021-09-18 MED ORDER — INSULIN GLARGINE 100 UNITS/ML SOLOSTAR PEN: 25.0000 [IU] | PEN_INJECTOR | Freq: Every day | SUBCUTANEOUS | 2 refills | Status: DC

## 2021-09-18 MED ORDER — INSULIN ASPART 100 UNIT/ML FLEXPEN: PEN_INJECTOR | SUBCUTANEOUS | 11 refills | Status: AC

## 2021-09-18 MED ORDER — K PHOS MONO-SOD PHOS DI & MONO 155-852-130 MG PO TABS
500.0000 mg | ORAL_TABLET | Freq: Three times a day (TID) | ORAL | Status: DC
  Administered 2021-09-18: 500 mg via ORAL
  Filled 2021-09-18 (×2): qty 2

## 2021-09-18 MED ORDER — INSULIN ASPART 100 UNIT/ML IJ SOLN
0.0000 [IU] | Freq: Three times a day (TID) | INTRAMUSCULAR | Status: DC
  Administered 2021-09-18: 11 [IU] via SUBCUTANEOUS
  Administered 2021-09-18: 5 [IU] via SUBCUTANEOUS

## 2021-09-18 MED ORDER — BAQSIMI ONE PACK 3 MG/DOSE NA POWD: 3.0000 mg | NASAL | 1 refills | Status: DC

## 2021-09-18 MED ORDER — "PEN NEEDLES 3/16"" 31G X 5 MM MISC": 1.0000 | Freq: Three times a day (TID) | 1 refills | Status: DC

## 2021-09-18 MED ORDER — POTASSIUM CHLORIDE CRYS ER 20 MEQ PO TBCR
40.0000 meq | EXTENDED_RELEASE_TABLET | Freq: Once | ORAL | Status: AC
  Administered 2021-09-18: 40 meq via ORAL
  Filled 2021-09-18: qty 2

## 2021-09-18 MED ORDER — FREESTYLE LIBRE 3 SENSOR MISC: 1 refills | Status: DC

## 2021-09-18 MED ORDER — INSULIN ASPART 100 UNIT/ML IJ SOLN: 0.0000 [IU] | Freq: Every day | INTRAMUSCULAR | Status: DC

## 2021-09-18 NOTE — Discharge Summary (Signed)
Physician Discharge Summary  Angelica Martinez HCW:[id number] DOB: 1976/01/16 DOA: 09/17/2021  PCP: Shade Flood, MD  Admit date: 09/17/2021 Discharge date: 09/18/2021 Admitted From: Home Disposition: Home Recommendations for Outpatient Follow-up:  Follow up with PCP in 1 week Ambulatory referral to endocrinology ordered Assess blood glucose and adjust insulin as appropriate Consider starting statins Please follow up on the following pending results: Insulin antibodies and glutamic acid decarboxylase autoantibodies  Home Health: Not indicated Equipment/Devices: Not indicated  Discharge Condition: Stable CODE STATUS: Full code   Hospital course 46 year old F with PMH of recent COVID-19 infection and GERD presented to ED with elevated blood glucose to greater than 500 at home on CBG check, polydipsia and polyuria, and found to be in DKA.  No prior diagnosis of diabetes.  She was started on insulin drip and IV fluid.  A1c was 12.0%.  C-peptide low at 0.3 suggesting type 1 diabetes. Diabetic ketoacidosis resolved with IV fluid and insulin.  She was transitioned to subcu insulin, and discharged on Lantus 25 units daily with NovoLog 4 units 3 times daily with meals and moderate sliding scale.  She was also provided with prescription for freestyle libre sensor and glucagon powder.  Ambulatory referral to endocrinology ordered.  She was counseled on management of diabetes, insulin injection and carb modified diet by diabetes coordinator prior to discharge.  Patient was diagnosed with COVID-19 about 9 days prior to presentation.  She was not treated.  She was asymptomatic.  Completed isolation precaution.  See individual problem list below for more.   Problems addressed during this hospitalization Principal Problem:   Diabetic ketoacidosis without coma associated with type 1 diabetes mellitus (HCC) Active Problems:   GERD   Hypokalemia   Newly diagnosed type 1 diabetes mellitus (HCC)    Hypophosphatemia   COVID-19 virus infection   Vital signs Vitals:   09/18/21 0800 09/18/21 0900 09/18/21 1200 09/18/21 1513  BP: 125/74 (!) 132/56  (!) 133/91  Pulse: 77 78  82  Temp: 98.3 F (36.8 C)  98.4 F (36.9 C)   Resp: 13 17  16   Height:      Weight:      SpO2: 98% 98%  96%  TempSrc: Oral  Oral   BMI (Calculated):         Discharge exam  GENERAL: No apparent distress.  Nontoxic. HEENT: MMM.  Vision and hearing grossly intact.  NECK: Supple.  No apparent JVD.  RESP:  No IWOB.  Fair aeration bilaterally. CVS:  RRR. Heart sounds normal.  ABD/GI/GU: BS+. Abd soft, NTND.  MSK/EXT:  Moves extremities. No apparent deformity. No edema.  SKIN: no apparent skin lesion or wound NEURO: Awake and alert. Oriented appropriately.  No apparent focal neuro deficit. PSYCH: Calm. Normal affect.   Discharge Instructions Discharge Instructions     Ambulatory referral to Endocrinology   Complete by: As directed    Diet - low sodium heart healthy   Complete by: As directed    Discharge instructions   Complete by: As directed    Managing your diabetes:  Your A1c is 12.0%. Your goal A1c is less than 7.0%. See below for more information about A1c.  Check your blood glucose for times a day 15 minutes before each meal and at bedtime.  Keep blood glucose log. Inject 25 units of Lantus daily at the same time. You may increase by 2 units the next day if your blood glucose is > 180 Inject Humalog 4 units plus sliding  scale about 15 minutes before each meal. Skip if you blood glucose is less than 100 Eat regular meals (breakfast, lunch and dinner) around-the-clock. You may snack as needed. See below about few tips and recommendations on diet and exercise.  Watch for symptoms of low blood sugar (hypoglycemia). Read below about these symptoms and management. Please follow-up with your primary care doctor in 1 to 2 weeks.  Take your medications and blood glucose log to your follow-up. Please  find an eye doctor and schedule an appointment as soon as possible. Do not walk barefoot.  Wear appropriate shoe with good cushion . Check your feet for any skin break or wound daily.   Increase activity slowly   Complete by: As directed       Allergies as of 09/18/2021       Reactions   Tylenol [acetaminophen] Palpitations   Metronidazole    Other         Medication List     TAKE these medications    ADVIL DUAL ACTION PO Take 1 tablet by mouth every 6 (six) hours as needed (pain).   Baqsimi One Pack 3 MG/DOSE Powd Generic drug: Glucagon Place 3 mg into the nose every 15 (fifteen) minutes.   Basaglar KwikPen 100 UNIT/ML Inject 25 Units into the skin daily.   FreeStyle Libre 3 Sensor Misc Place 1 sensor on the skin every 14 days. Use to check glucose continuously   insulin aspart 100 UNIT/ML FlexPen Commonly known as: NOVOLOG Inject 4 units TID before meals plus sliding scale as below CBG < 70: Implement Hypoglycemia Standing Orders and refer to Hypoglycemia Standing Orders sidebar report CBG 70 - 120: 0 units CBG 121 - 150: 2 units CBG 151 - 200: 3 units CBG 201 - 250: 5 units CBG 251 - 300: 8 units CBG 301 - 350: 11 units CBG 351 - 400: 15 units CBG > 400: call MD and obtain STAT lab verification   ondansetron 4 MG tablet Commonly known as: Zofran Take 1 tablet (4 mg total) by mouth every 6 (six) hours as needed for up to 5 days for nausea or vomiting.   Pen Needles 3/16" 31G X 5 MM Misc 1 Pen by Does not apply route 4 (four) times daily -  before meals and at bedtime.   phosphorus 155-852-130 MG tablet Commonly known as: K PHOS NEUTRAL Take 2 tablets (500 mg total) by mouth 3 (three) times daily.        Consultations: Diabetic coordinator  Procedures/Studies:   DG Chest Portable 1 View  Result Date: 09/17/2021 CLINICAL DATA:  Shortness of breath, COVID-19 positivity EXAM: PORTABLE CHEST 1 VIEW COMPARISON:  None Available. FINDINGS: The heart size and  mediastinal contours are within normal limits. Both lungs are clear. The visualized skeletal structures are unremarkable. IMPRESSION: No active disease. Electronically Signed   By: Alcide Clever M.D.   On: 09/17/2021 03:57   MM 3D SCREEN BREAST BILATERAL  Result Date: 08/23/2021 CLINICAL DATA:  Screening. EXAM: DIGITAL SCREENING BILATERAL MAMMOGRAM WITH TOMOSYNTHESIS AND CAD TECHNIQUE: Bilateral screening digital craniocaudal and mediolateral oblique mammograms were obtained. Bilateral screening digital breast tomosynthesis was performed. The images were evaluated with computer-aided detection. COMPARISON:  Previous exam(s). ACR Breast Density Category c: The breast tissue is heterogeneously dense, which may obscure small masses. FINDINGS: There are no findings suspicious for malignancy. IMPRESSION: No mammographic evidence of malignancy. A result letter of this screening mammogram will be mailed directly to the patient. RECOMMENDATION: Screening mammogram  in one year. (Code:[id number]-B-01Y) BI-RADS CATEGORY  1: Negative. Electronically Signed   By: Annia Belt M.D.   On: 08/23/2021 09:26       The results of significant diagnostics from this hospitalization (including imaging, microbiology, ancillary and laboratory) are listed below for reference.     Microbiology: Recent Results (from the past 240 hour(s))  MRSA Next Gen by PCR, Nasal     Status: None   Collection Time: 09/17/21  2:25 PM   Specimen: Nasal Mucosa; Nasal Swab  Result Value Ref Range Status   MRSA by PCR Next Gen NOT DETECTED NOT DETECTED Final    Comment: (NOTE) The GeneXpert MRSA Assay (FDA approved for NASAL specimens only), is one component of a comprehensive MRSA colonization surveillance program. It is not intended to diagnose MRSA infection nor to guide or monitor treatment for MRSA infections. Test performance is not FDA approved in patients less than 26 years old. Performed at [facility], [address] [place], [place] [address]      Labs:  CBC: Recent Labs  Lab 09/17/21 0145 09/18/21 0746  WBC 4.6 2.9*  NEUTROABS 3.3  --   HGB 15.8* 13.1  HCT 44.7 36.7  MCV 94.5 94.1  PLT 194 134*   BMP &GFR Recent Labs  Lab 09/17/21 0145 09/17/21 0448 09/17/21 1109 09/17/21 1444 09/17/21 1903 09/18/21 0746  NA 134* 138 140 139 136 138  K 3.7 2.8* 2.5* 2.9* 3.2* 3.4*  CL 106 112* 113* 112* 110 108  CO2 8* 12* 20* 17* 19* 20*  GLUCOSE 425* 274* 190* 161* 233* 267*  BUN 12 10 8 8 8 8   CREATININE 0.87 0.78 0.59 0.54 0.57 0.67  CALCIUM 8.8* 8.4* 8.5* 8.8* 8.2* 8.3*  MG 1.9  --   --   --   --  1.8  PHOS  --   --   --   --   --  1.0*   Estimated Creatinine Clearance: 80.9 mL/min (by C-G formula based on SCr of 0.67 mg/dL). Liver & Pancreas: Recent Labs  Lab 09/17/21 0145 09/18/21 0746  AST 15  --   ALT 16  --   ALKPHOS 65  --   BILITOT 1.4*  --   PROT 6.8  --   ALBUMIN 4.0 3.0*   No results for input(s): "LIPASE", "AMYLASE" in the last 168 hours. No results for input(s): "AMMONIA" in the last 168 hours. Diabetic: Recent Labs    09/17/21 0145  HGBA1C 12.0*   Recent Labs  Lab 09/17/21 1557 09/17/21 2121 09/18/21 0138 09/18/21 0800 09/18/21 1227  GLUCAP 167* 271* 259* 257* 309*   Cardiac Enzymes: No results for input(s): "CKTOTAL", "CKMB", "CKMBINDEX", "TROPONINI" in the last 168 hours. No results for input(s): "PROBNP" in the last 8760 hours. Coagulation Profile: No results for input(s): "INR", "PROTIME" in the last 168 hours. Thyroid Function Tests: No results for input(s): "TSH", "T4TOTAL", "FREET4", "T3FREE", "THYROIDAB" in the last 72 hours. Lipid Profile: No results for input(s): "CHOL", "HDL", "LDLCALC", "TRIG", "CHOLHDL", "LDLDIRECT" in the last 72 hours. Anemia Panel: No results for input(s): "VITAMINB12", "FOLATE", "FERRITIN", "TIBC", "IRON", "RETICCTPCT" in the last 72 hours. Urine analysis:    Component Value Date/Time   COLORURINE STRAW (A)  09/17/2021 0145   APPEARANCEUR CLEAR 09/17/2021 0145   LABSPEC 1.022 09/17/2021 0145   PHURINE 5.0 09/17/2021 0145   GLUCOSEU >=500 (A) 09/17/2021 0145   GLUCOSEU NEGATIVE 03/23/2008 0820   HGBUR MODERATE (A) 09/17/2021 0145  BILIRUBINUR NEGATIVE 09/17/2021 0145   KETONESUR 80 (A) 09/17/2021 0145   PROTEINUR 30 (A) 09/17/2021 0145   UROBILINOGEN 0.2 mg/dL 44/81/8563 1497   NITRITE NEGATIVE 09/17/2021 0145   LEUKOCYTESUR NEGATIVE 09/17/2021 0145   Sepsis Labs: Invalid input(s): "PROCALCITONIN", "LACTICIDVEN"   SIGNED:  Almon Hercules, MD  [facility] 09/18/2021, 6:54 PM

## 2021-09-18 NOTE — TOC Initial Note (Signed)
Transition of Care [facility]) - Initial/Assessment Note    Patient Details  Name: Angelica Martinez MRN: [id number] Date of Birth: December 28, 1975  Transition of Care [facility]) CM/SW Contact:    Golda Acre, RN Phone Number: 09/18/2021, 9:39 AM  Clinical Narrative:                  Transition of Care [facility]) Screening Note   Patient Details  Name: Angelica Martinez Date of Birth: [birth year]   Transition of Care [facility]) CM/SW Contact:    Golda Acre, RN Phone Number: 09/18/2021, 9:39 AM    Transition of Care Department [facility]) has reviewed patient and no TOC needs have been identified at this time. We will continue to monitor patient advancement through interdisciplinary progression rounds. If new patient transition needs arise, please place a TOC consult.    Expected Discharge Plan: Home/Self Care Barriers to Discharge: Continued Medical Work up   Patient Goals and CMS Choice Patient states their goals for this hospitalization and ongoing recovery are:: to go back home CMS Medicare.gov Compare Post Acute Care list provided to:: Patient    Expected Discharge Plan and Services Expected Discharge Plan: Home/Self Care   Discharge Planning Services: CM Consult   Living arrangements for the past 2 months: Single Family Home                                      Prior Living Arrangements/Services Living arrangements for the past 2 months: Single Family Home Lives with:: Self Patient language and need for interpreter reviewed:: Yes Do you feel safe going back to the place where you live?: Yes            Criminal Activity/Legal Involvement Pertinent to Current Situation/Hospitalization: No - Comment as needed  Activities of Daily Living Home Assistive Devices/Equipment: None ADL Screening (condition at time of admission) Patient's cognitive ability adequate to safely complete daily activities?: Yes Is the patient deaf or have difficulty hearing?: No Does the  patient have difficulty seeing, even when wearing glasses/contacts?: No Does the patient have difficulty concentrating, remembering, or making decisions?: No Patient able to express need for assistance with ADLs?: Yes Does the patient have difficulty dressing or bathing?: No Independently performs ADLs?: Yes (appropriate for developmental age) Does the patient have difficulty walking or climbing stairs?: No Weakness of Legs: None Weakness of Arms/Hands: None  Permission Sought/Granted                  Emotional Assessment Appearance:: Appears stated age Attitude/Demeanor/Rapport: Engaged Affect (typically observed): Calm Orientation: : Oriented to Self, Oriented to Place, Oriented to  Time, Oriented to Situation Alcohol / Substance Use: Not Applicable Psych Involvement: No (comment)  Admission diagnosis:  DKA (diabetic ketoacidosis) (HCC) [E11.10] Diabetic ketoacidosis without coma associated with type 1 diabetes mellitus (HCC) [E10.10] COVID-19 [U07.1] Patient Active Problem List   Diagnosis Date Noted   DKA (diabetic ketoacidosis) (HCC) 09/17/2021   Hypokalemia 09/17/2021   Genetic testing 05/31/2020   Family history of prostate cancer    Family history of gene mutation    Family history of breast cancer    GERD 01/20/2008   ECZEMA 01/20/2008   ELEVATED BLOOD PRESSURE WITHOUT DIAGNOSIS OF HYPERTENSION 01/20/2008   ACL TEAR 07/10/2006   PCP:  Shade Flood, MD Pharmacy:   [id number] - [place], [place] - [address] AT [facility]  OF [facility] [address] [place] [address] Phone: [phone] Fax: [phone]  [facility] [phone] - [place], [place] - [address] AT [facility] OF [address] [place] [address] Phone: [phone] Fax: [phone]  [facility] - [place], [place] - [address] [place] [address] Phone:  [phone] Fax: [phone]     Social Determinants of Health (SDOH) Interventions    Readmission Risk Interventions     No data to display

## 2021-09-18 NOTE — Progress Notes (Addendum)
Patient and family received discharge paperwork with appropriate discharge education. Unit number provided for issues with prescriptions. Patient took cell phone and her belongings.

## 2021-09-18 NOTE — Progress Notes (Signed)
Inpatient Diabetes Program Recommendations  AACE/ADA: New Consensus Statement on Inpatient Glycemic Control (2015)  Target Ranges:  Prepandial:   less than 140 mg/dL      Peak postprandial:   less than 180 mg/dL (1-2 hours)      Critically ill patients:  140 - 180 mg/dL   Lab Results  Component Value Date   GLUCAP 309 (H) 09/18/2021   HGBA1C 12.0 (H) 09/17/2021    Review of Glycemic Control  Latest Reference Range & Units 09/17/21 21:21 09/18/21 01:38 09/18/21 08:00 09/18/21 12:27  Glucose-Capillary 70 - 99 mg/dL 271 (H) 259 (H) 257 (H) 309 (H)   Diabetes history: New diagnosis of DM Outpatient Diabetes medications: None Current orders for Inpatient glycemic control:  Novolog 4 units tid with meals Novolog 0-15 units tid with meals and HS Semglee 15 units bid  Inpatient Diabetes Program Recommendations:         Note plans for patient to discharge today.  Education done with patient and her sisters.  Patient has a nephew with Type 1 DM and sister is a Therapist, sports.  Gave her handout on Type 1 DM in Adults in case this is what is determined by labs (c-peptide=0.3).       Spoke with pt about new diagnosis.  Discussed A1C results with him and explained what an A1C is, basic pathophysiology of DM, basic home care, importance of checking CBGs and maintaining good CBG control to prevent long-term and short-term complications.  Reviewed signs and symptoms of hyperglycemia and hypoglycemia.  Patient and family able to teach back. Educational booklet and insulin starter kit at bedside.       Educated patient and spouse on insulin pen use at home.  Reviewed contents of insulin flexpen starter kit.  Reviewed all steps if insulin pen including attachment of needle, 2-unit air shot, dialing up dose, giving injection, removing needle, disposal of sharps, storage of unused insulin, disposal of insulin etc.  Patient able to provide successful return demonstration.  Also reviewed troubleshooting with insulin pen.   MD to give patient Rxs for insulin pens and insulin pen needles.      Discussed importance of eating CHO consistent diet.  Recommended at least 30-45 grams of CHO per meal (although patient states this is a lot for her).  Explained the difference between basal insulin and fast acting insulin as well.        At d/c recommend,  recommend Basaglar 25 units daily -[phone] (first dose tomorrow morning), Humalog (#[id number]) very sensitive correction tid with meals (starts at 151 mg/dL) and Humalog 4 units tid with meals - she is wearing the CGM (Freestyle libre CGM- 3- #[id number])- also needs order for baqsimi (intranasal glucagon [id number]), and insulin pen needles (#[id number]). Spoke briefly with sister and explained that if insulin prices are high when prescriptions picked up from pharmacy, the importance of using coupons from manufacturers.        Also assisted patient in applying Freestyle Libre-2 per MD orders.  Education done regarding application and changing CGM sensor (alternate every 14 days on back of arms), 1 hour warm-up, use of glucometer when alert displays, how to scan CGM for glucose reading and information for PCP. Patient has also been given educational packet regarding use CGM sensor including the 1-800 toll free number for any questions, problems or needs related to the [facility] sensors or reader.    Sensor applied by patient to (R or L) Arm at (time).  Explained that glucose readings  will not be available until 1 hour after application. Reviewed use of CGM including how to scan, changing Sensor, Vitamin C warning, arrows with glucose readings, and Freestyle app.    Thanks,  Adah Perl, RN, BC-ADM Inpatient Diabetes Coordinator Pager [phone]  (8a-5p)

## 2021-09-18 NOTE — Progress Notes (Signed)
Notified MD Gonfa in regards to patient's phosphorus level being 1.0 at 0929. No new orders at this time. Will continue to monitor and assess.

## 2021-09-19 ENCOUNTER — Telehealth: Payer: Self-pay

## 2021-09-19 ENCOUNTER — Encounter: Payer: Self-pay | Admitting: Family Medicine

## 2021-09-19 LAB — RENAL FUNCTION PANEL
Albumin: 3 g/dL — ABNORMAL LOW (ref 3.5–5.0)
Anion gap: 10 (ref 5–15)
BUN: 8 mg/dL (ref 6–20)
CO2: 20 mmol/L — ABNORMAL LOW (ref 22–32)
Calcium: 8.3 mg/dL — ABNORMAL LOW (ref 8.9–10.3)
Chloride: 108 mmol/L (ref 98–111)
Creatinine, Ser: 0.67 mg/dL (ref 0.44–1.00)
GFR, Estimated: >60 mL/min (ref >60)
Glucose, Bld: 267 mg/dL — ABNORMAL HIGH (ref 70–99)
Phosphorus: 1 mg/dL — CL (ref 2.5–4.6)
Potassium: 3.4 mmol/L — ABNORMAL LOW (ref 3.5–5.1)
Sodium: 138 mmol/L (ref 135–145)

## 2021-09-19 LAB — GLUTAMIC ACID DECARBOXYLASE AUTO ABS: Glutamic Acid Decarb Ab: 111.2 U/mL — ABNORMAL HIGH (ref 0.0–5.0)

## 2021-09-19 NOTE — Telephone Encounter (Signed)
Transition Care Management Unsuccessful Follow-up Telephone Call  Date of discharge and from where:  [place] 09/18/2021  Attempts:  1st Attempt  Reason for unsuccessful TCM follow-up call:  No answer/busy    

## 2021-09-20 ENCOUNTER — Other Ambulatory Visit: Payer: Self-pay | Admitting: Family Medicine

## 2021-09-20 DIAGNOSIS — E1010 Type 1 diabetes mellitus with ketoacidosis without coma: Secondary | ICD-10-CM

## 2021-09-20 NOTE — Telephone Encounter (Signed)
Transition Care Management Unsuccessful Follow-up Telephone Call  Date of discharge and from where:  [place] 09/18/2021  Attempts:  2nd Attempt  Reason for unsuccessful TCM follow-up call:  Unable to leave message    

## 2021-09-20 NOTE — Progress Notes (Signed)
Recent admission for DKA, appears to be type 1 DM with elevated glutamic acid decarboxylase antibody. Referral placed to endocrinology, Dr. Evlyn Kanner for next week.

## 2021-09-23 ENCOUNTER — Encounter: Payer: Self-pay | Admitting: Family Medicine

## 2021-09-23 ENCOUNTER — Ambulatory Visit (INDEPENDENT_AMBULATORY_CARE_PROVIDER_SITE_OTHER): Payer: No Typology Code available for payment source | Admitting: Family Medicine

## 2021-09-23 VITALS — BP 120/70 | HR 85 | Temp 98.2°F | Resp 19 | Ht 62.0 in | Wt 165.8 lb

## 2021-09-23 DIAGNOSIS — E109 Type 1 diabetes mellitus without complications: Secondary | ICD-10-CM

## 2021-09-23 DIAGNOSIS — E1010 Type 1 diabetes mellitus with ketoacidosis without coma: Secondary | ICD-10-CM | POA: Diagnosis not present

## 2021-09-23 LAB — POCT URINALYSIS DIP (MANUAL ENTRY)
Bilirubin, UA: NEGATIVE
Glucose, UA: 250 mg/dL — AB
Ketones, POC UA: NEGATIVE mg/dL
Nitrite, UA: NEGATIVE
Protein Ur, POC: NEGATIVE mg/dL
Spec Grav, UA: <=1.005 — AB (ref 1.010–1.025)
Urobilinogen, UA: 0.2 E.U./dL
pH, UA: 6.5 (ref 5.0–8.0)

## 2021-09-23 MED ORDER — FREESTYLE LIBRE 3 SENSOR MISC: 2 refills | Status: DC

## 2021-09-23 NOTE — Patient Instructions (Addendum)
Decrease to 2 units with meals plus the same sliding scale, no change in Lantus for now.  I will look into options for endocrinology.   Walking, low intensity activity for now only. If any concerns on labs I will let you know.  Hang in there.

## 2021-09-23 NOTE — Progress Notes (Unsigned)
Subjective:  Patient ID: Angelica Martinez, female    DOB: 1975/07/27  Age: 46 y.o. MRN: [id number]  CC:  Chief Complaint  Patient presents with   Diabetes    Hospital follow up    HPI Angelica Martinez presents for  Hospital follow-up.  Unable to contact on transition care phone call 09/20/2021.  Admitted August 1 through August 2, with new diagnosis of type 1 diabetes, diabetic ketoacidosis.  Diabetes with DKA, without coma Initial video visit with me on July 31, COVID infection at previous week, respiratory symptoms were improved but did note urinary frequency, increased thirst.  Plan for lab visit next morning but she was able to check home blood sugar that night and glucose over 500. A1c 12, C-peptide low at 0.3, positive glutamic acid decarboxylase antibody indicating type 1 diabetes.  Insulin antibodies pending. She was treated with insulin drip, IV fluids.  DKA resolved with this treatment, transition to subcutaneous insulin and discharged on Lantus 25 units daily with NovoLog 40 units 3 times daily with meals, moderate sliding scale.  Freestyle libre sensor and glucagon powder given.  Referral to endocrinology placed and had inpatient counseling on management of diabetes, carb modified diet and insulin ejections by diabetes coordinator. Borderline hypokalemia on labs 8/2, hypophosphatemia - plan for repeat today.  Since hospitalization: Initially felt fatigue/depleted. Up and down feeling. Does not feel well when lower readings single low of 68 2 days ago at 5am. improved to 110 with juice. Treated with juice box. 90's this morning - slight HA. Highs in 200 at times. 205 now, 99 at dinner.  On lantus 25 units QD - no missed doses.  Novolog sliding scale 2-4 units in addition to 4u with meals. (121-150 2u, 151-200 3u, 210-250 5u).  Feels better past 24 hours.  Not fasting.  Optho visit later this month. No further covid symptoms, cough resolved. Congestion gone.   Lab  Results  Component Value Date   NA 139 09/23/2021   K 3.6 09/23/2021   CL 101 09/23/2021   CO2 29 09/23/2021   Lab Results  Component Value Date   CHOL 170 12/02/2018   HDL 86 12/02/2018   LDLCALC 72 12/02/2018   TRIG 60 12/02/2018   CHOLHDL 2.0 12/02/2018    History Patient Active Problem List   Diagnosis Date Noted   Newly diagnosed type 1 diabetes mellitus (HCC) 09/18/2021   Hypophosphatemia 09/18/2021   COVID-19 virus infection 09/18/2021   Diabetic ketoacidosis without coma associated with type 1 diabetes mellitus (HCC) 09/17/2021   Hypokalemia 09/17/2021   Genetic testing 05/31/2020   Family history of prostate cancer    Family history of gene mutation    Family history of breast cancer    GERD 01/20/2008   ECZEMA 01/20/2008   ELEVATED BLOOD PRESSURE WITHOUT DIAGNOSIS OF HYPERTENSION 01/20/2008   ACL TEAR 07/10/2006   Past Medical History:  Diagnosis Date   Family history of breast cancer    Family history of gene mutation    Family history of prostate cancer    Past Surgical History:  Procedure Laterality Date   BREAST CYST EXCISION Right    fatty tumor 2002   Allergies  Allergen Reactions   Tylenol [Acetaminophen] Palpitations   Metronidazole    Other    Prior to Admission medications   Medication Sig Start Date End Date Taking? Authorizing Provider  Continuous Blood Gluc Sensor (FREESTYLE LIBRE 3 SENSOR) MISC Place 1 sensor on the skin every 14 days.  Use to check glucose continuously 09/18/21  Yes Gonfa, Taye T, MD  Glucagon (BAQSIMI ONE PACK) 3 MG/DOSE POWD Place 3 mg into the nose every 15 (fifteen) minutes. 09/18/21  Yes Almon Hercules, MD  Ibuprofen-Acetaminophen (ADVIL DUAL ACTION PO) Take 1 tablet by mouth every 6 (six) hours as needed (pain).   Yes [provider]  insulin aspart (NOVOLOG) 100 UNIT/ML FlexPen Inject 4 units TID before meals plus sliding scale as below CBG < 70: Implement Hypoglycemia Standing Orders and refer to Hypoglycemia  Standing Orders sidebar report CBG 70 - 120: 0 units CBG 121 - 150: 2 units CBG 151 - 200: 3 units CBG 201 - 250: 5 units CBG 251 - 300: 8 units CBG 301 - 350: 11 units CBG 351 - 400: 15 units CBG > 400: call MD and obtain STAT lab verification 09/18/21  Yes Almon Hercules, MD  Insulin Glargine (BASAGLAR KWIKPEN) 100 UNIT/ML Inject 25 Units into the skin daily. 09/18/21  Yes Almon Hercules, MD  Insulin Pen Needle (PEN NEEDLES 3/16") 31G X 5 MM MISC 1 Pen by Does not apply route 4 (four) times daily -  before meals and at bedtime. 09/18/21  Yes Almon Hercules, MD  ondansetron (ZOFRAN) 4 MG tablet Take 1 tablet (4 mg total) by mouth every 6 (six) hours as needed for up to 5 days for nausea or vomiting. 09/18/21 09/23/21 Yes Almon Hercules, MD  phosphorus (K PHOS NEUTRAL) 155-852-130 MG tablet Take 2 tablets (500 mg total) by mouth 3 (three) times daily. Patient not taking: Reported on 09/23/2021 09/18/21   Almon Hercules, MD   Social History   Socioeconomic History   Marital status: Single    Spouse name: Not on file   Number of children: Not on file   Years of education: Not on file   Highest education level: Not on file  Occupational History   Not on file  Tobacco Use   Smoking status: Never   Smokeless tobacco: Never  Substance and Sexual Activity   Alcohol use: Yes    Alcohol/week: 2.0 standard drinks of alcohol    Types: 2 Glasses of wine per week   Drug use: Never   Sexual activity: Yes  Other Topics Concern   Not on file  Social History Narrative   Not on file   Social Determinants of Health   Financial Resource Strain: Not on file  Food Insecurity: Not on file  Transportation Needs: Not on file  Physical Activity: Not on file  Stress: Not on file  Social Connections: Not on file  Intimate Partner Violence: Not on file    Review of Systems Per hpi  Objective:   Vitals:   09/23/21 1410  BP: 120/70  Pulse: 85  Resp: 19  Temp: 98.2 F (36.8 C)  SpO2: 99%  Weight: 165 lb 12.8 oz  (75.2 kg)  Height: 5\' 2"  (1.575 m)     Physical Exam Vitals reviewed.  Constitutional:      Appearance: Normal appearance. She is well-developed.  HENT:     Head: Normocephalic and atraumatic.  Eyes:     Conjunctiva/sclera: Conjunctivae normal.     Pupils: Pupils are equal, round, and reactive to light.  Neck:     Vascular: No carotid bruit.  Cardiovascular:     Rate and Rhythm: Normal rate and regular rhythm.     Heart sounds: Normal heart sounds.  Pulmonary:     Effort: Pulmonary effort is  normal.     Breath sounds: Normal breath sounds.  Abdominal:     Palpations: Abdomen is soft. There is no pulsatile mass.     Tenderness: There is no abdominal tenderness.  Musculoskeletal:     Right lower leg: No edema.     Left lower leg: No edema.  Skin:    General: Skin is warm and dry.  Neurological:     Mental Status: She is alert and oriented to person, place, and time.  Psychiatric:        Mood and Affect: Mood normal.        Behavior: Behavior normal.     Assessment & Plan:  Angelica Martinez is a 46 y.o. female . Diabetic ketoacidosis without coma associated with type 1 diabetes mellitus (HCC) - Plan: Continuous Blood Gluc Sensor (FREESTYLE LIBRE 3 SENSOR) MISC, Comprehensive metabolic panel, POCT urinalysis dipstick  Newly diagnosed type 1 diabetes mellitus (HCC) - Plan: Continuous Blood Gluc Sensor (FREESTYLE LIBRE 3 SENSOR) MISC, Comprehensive metabolic panel, POCT urinalysis dipstick  Recent new diagnosis of diabetes, type I likely based on low C-peptide, positive glutamic acid decarboxylase antibody, recent insulin antibodies noted to be normal.  Improving glycemic control, DKA resolved.  Still some variability in readings, but with a few borderline lows as above.  Will decrease her mealtime insulin slightly for now, remain on same dose of basal insulin.  She does have a CGM so that will be helpful in monitoring trends and these changes.  Hypoglycemia precautions given.   Repeat labs with CMP to evaluate prior hypokalemia and bicarb.  Low intensity exercise discussed with close monitoring of glycemic control with exercise, can discuss specific levels and adjustments in sliding scale as we increase activity and review trends at next visit.  Recheck in the next 2 weeks with ER precautions given.  Will also try to arrange endocrinologist for continued care in the interim. Meds ordered this encounter  Medications   Continuous Blood Gluc Sensor (FREESTYLE LIBRE 3 SENSOR) MISC    Sig: Place 1 sensor on the skin every 14 days. Use to check glucose continuously    Dispense:  6 each    Refill:  2   Patient Instructions  Decrease to 2 units with meals plus the same sliding scale, no change in Lantus for now.  I will look into options for endocrinology.   Walking, low intensity activity for now only. If any concerns on labs I will let you know.  Hang in there.       Signed,   Meredith Staggers, MD [facility], [facility] 09/24/21 11:37 AM   09/24/21 addendum:  11:53 AM Called pt with lab results.  Last ate 9am, 30 carbs plus eggs. Glucose under 120 - about 109 prior to meal - 2 units insulin given.  7:30 am today - upper 90's,   Starting to drop around 11 pm last night- reading of 69 - ate skittles. no lows overnight beyond that time.  2 units with dinner last night. At 9pm. Reading about 120.  Yesterday afternoon trending lower at 6:30 - had mini RX bar.  Low of 68 at 8:00, had juice.  Currently 185. Taking 25 units once per day - due in 1 hour. (Taking at 1pm).   Will drop basaglar to 22 units for now, continue mealtime novolog at 2 units plus sliding scale if needed with meals, update on readings in next few days with hypoglycemia precautions.

## 2021-09-24 LAB — COMPREHENSIVE METABOLIC PANEL
ALT: 50 U/L — ABNORMAL HIGH (ref 0–35)
AST: 68 U/L — ABNORMAL HIGH (ref 0–37)
Albumin: 3.9 g/dL (ref 3.5–5.2)
Alkaline Phosphatase: 55 U/L (ref 39–117)
BUN: 11 mg/dL (ref 6–23)
CO2: 29 mEq/L (ref 19–32)
Calcium: 9 mg/dL (ref 8.4–10.5)
Chloride: 101 mEq/L (ref 96–112)
Creatinine, Ser: 0.82 mg/dL (ref 0.40–1.20)
GFR: 86.28 mL/min (ref >60.00)
Glucose, Bld: 180 mg/dL — ABNORMAL HIGH (ref 70–99)
Potassium: 3.6 mEq/L (ref 3.5–5.1)
Sodium: 139 mEq/L (ref 135–145)
Total Bilirubin: 0.3 mg/dL (ref 0.2–1.2)
Total Protein: 6.2 g/dL (ref 6.0–8.3)

## 2021-09-24 LAB — INSULIN ANTIBODIES, BLOOD: Insulin Antibodies, Human: <5 uU/mL

## 2021-09-26 ENCOUNTER — Encounter: Payer: Self-pay | Admitting: Family Medicine

## 2021-09-28 ENCOUNTER — Other Ambulatory Visit: Payer: Self-pay | Admitting: Family Medicine

## 2021-09-28 DIAGNOSIS — E1010 Type 1 diabetes mellitus with ketoacidosis without coma: Secondary | ICD-10-CM

## 2021-09-28 DIAGNOSIS — E109 Type 1 diabetes mellitus without complications: Secondary | ICD-10-CM

## 2021-09-28 NOTE — Progress Notes (Signed)
Referral to endocrine for new diagnosis type 1 DM.

## 2021-10-07 ENCOUNTER — Encounter: Payer: Self-pay | Admitting: Family Medicine

## 2021-10-07 ENCOUNTER — Ambulatory Visit (INDEPENDENT_AMBULATORY_CARE_PROVIDER_SITE_OTHER): Payer: No Typology Code available for payment source | Admitting: Family Medicine

## 2021-10-07 VITALS — BP 122/64 | HR 69 | Temp 98.2°F | Resp 16 | Ht 62.0 in | Wt 159.4 lb

## 2021-10-07 DIAGNOSIS — E162 Hypoglycemia, unspecified: Secondary | ICD-10-CM

## 2021-10-07 DIAGNOSIS — E109 Type 1 diabetes mellitus without complications: Secondary | ICD-10-CM

## 2021-10-07 LAB — MICROALBUMIN / CREATININE URINE RATIO
Creatinine,U: 17.6 mg/dL
Microalb Creat Ratio: 4 mg/g (ref 0.0–30.0)
Microalb, Ur: <0.7 mg/dL (ref 0.0–1.9)

## 2021-10-07 NOTE — Patient Instructions (Addendum)
Decrease lantus to 18 units. Ok to remain on same dose mealtime insulin unless lower readings or not eating full meal - skip mealtime insulin. If any further lows, we may need to decrease lantus further. Send me an update in next few days.    Insulin Injection Instructions, Single Insulin Dose, Adult There are many different types of insulin. The type of insulin that you take may determine how many injections you give yourself and when you need to give the injections. Supplies needed: Soap and water. Insulin medicine in small bottles (vials). A new, unused insulin syringe. Alcohol wipes. A disposal container for sharp items (sharps container), such as an empty plastic bottle with a cover. How to choose a site for injection The body absorbs insulin differently, depending on where the insulin is injected (injection site). It is best to inject insulin into the same body area each time; for example, always in the abdomen. However, you should use a different spot in that area for each injection. Do not inject the insulin in the same spot each time. There are five main areas that can be used for injecting. These areas are: Abdomen. This is the preferred area. Front of thigh. Upper, outer side of thigh. Upper, outer side of arm. Upper, outer part of buttock. How to give a single-dose insulin injection Get ready Wash your hands with soap and water for at least 20 seconds. If soap and water are not available, use hand sanitizer. Test your blood sugar (glucose) level and write down that number. Follow any instructions from your health care provider about what to do if your blood glucose level is higher or lower than your normal range. Make sure the vial you are using has the right kind of insulin and there is enough insulin for your dose. Check the expiration date. Check to make sure you have the correct type of insulin syringe for the concentration of insulin that you are using. Use a new, unused  insulin syringe each time you need to inject insulin. If you are using CLEAR insulin, check to see that it is clear and free of clumps. If you are using CLOUDY insulin, mix it by gently rolling the insulin vial between your palms several times. Do not shake the vial. Remove the plastic pop-top covering from the vial of insulin. This type of covering is present on a vial when it is new. Use an alcohol wipe to clean the rubber top of the vial. Remove the plastic cover from the syringe needle. Do not let the needle touch anything. Push air into the vial To bring (draw up) air into the syringe, slowly pull back on the syringe plunger. Stop pulling the plunger when the dose indicator gets to the number of units that you will be using. While you keep the vial right-side-up, poke the needle through the rubber top of the vial. Do not turn the vial upside down to do this. Push the plunger all the way into the syringe. Doing that will push air into the vial. Do not take the needle out of the vial yet. Fill the syringe  While the needle is still in the vial, turn the vial upside down and hold it at eye level. Slowly pull back on the plunger. Stop pulling the plunger when the dose indicator gets to the desired number of units. If you see air bubbles in the syringe, slowly move the plunger up and down 2 or 3 times to make them go away. If  you had to move the plunger to get rid of air bubbles, pull back the plunger until the dose indicator returns to the correct dose. Remove the needle from the vial. Do not let the needle touch anything. Inject the insulin  Use an alcohol wipe to clean the site where you will be inserting the needle. Let the site air-dry. Hold the syringe in your writing hand like a pencil. If directed by your health care provider, use your other hand to pinch and hold about an inch (2.5 cm) of skin at the injection site. Do not directly touch the cleaned part of the skin. Gently but  quickly, put the needle straight into the skin. The needle should be at a 45-degree angle or a 90-degree angle (perpendicular) to the skin, as told by your health care provider. Push the needle in as far as it will go (to the hub). When the needle is completely inserted into the skin, let go of the skin that you are pinching. Continue to hold the syringe in place with your writing hand. Use your thumb or index finger of your writing hand to push the plunger all the way into the syringe to inject the insulin. Wait 5-10 seconds, then pull the needle straight out of the skin. This will allow all of the insulin to go from the syringe and needle into your body. If bleeding occurs, press and hold gauze over the injection site until any bleeding stops. Do not rub the area. Do not put the plastic cover back on the needle. Discard the syringe and needle directly into a sharps container. How to throw away supplies Discard all used needles in a sharps container. Follow the disposal regulations for the area where you live. Do not use any syringe or needle more than one time. Throw away empty vials in the regular trash. Questions to ask your health care provider How often should I be taking insulin? How often should I check my blood glucose? What amount of insulin should I be taking at each time? What are the side effects? What should I do if my blood glucose is too high? What should I do if my blood glucose is too low? What should I do if I forget to take my insulin? What number should I call if I have questions? Where to find more information American Diabetes Association (ADA): diabetes.org Association of Diabetes Care and Education Specialists (ADCES): diabeteseducator.org Summary Before you give yourself an insulin injection, be sure to wash your hands for at least 20 seconds and test your blood glucose level. Write down that number. Check the expiration date and the type of insulin that you are  using. The type of insulin that you take may determine how many injections you give yourself and when you need to give the injections. It is best to inject insulin into the same body area each time; for example, always in the abdomen. However, you should use a different spot in that area for each injection. Do not use an insulin syringe more than one time. This information is not intended to replace advice given to you by your health care provider. Make sure you discuss any questions you have with your health care provider. Document Revised: 04/23/2020 Document Reviewed: 12/03/2019 Elsevier Patient Education  2023 [employer].

## 2021-10-07 NOTE — Progress Notes (Signed)
Subjective:  Patient ID: Angelica Martinez, female    DOB: Mar 28, 1975  Age: 46 y.o. MRN: [id number]  CC:  Chief Complaint  Patient presents with   Diabetes    Pt here for recheck notes several lows at night, notes chills ad headache when this will happen, has progressed exercise, notes back to work    HPI Angelica Martinez presents for   Diabetes: Newly diagnosed earlier this month.  Hospitalized for DKA at that time.  Suspected type I with low C-peptide, positive glutamic acid decarboxylase.  Referred to endocrinology.  Initially was discharged on 25 units of Lantus with NovoLog 2-4 units 3 times daily along with moderate sliding scale.  Variable readings at her August 7 visit, including some symptomatic lows.  We decreased to 2 units of the NovoLog with meals, same sliding scale, remain on same dose Lantus.  On August 8, Lantus was decreased to 22 units due to some persistent lows.  Remained on 2 units with meals plus sliding scale.  She was having some lower readings before dinner, correcting with granola bar.  She had not required glucagon treatment for symptomatic lows.  Referred to Dr. Tamala Julian at [facility] for endocrinology. Screenshots reviewed on August 12 for her CGM.  Still some elevations but less lows.  Since last visit she has increased her exercise, and is back to work.  Last week  experienced some lows at night with headache, chills as symptoms. 4 separate lows las Friday night. 65, 69. Treated with juice, granola bar. Feels like dropping every 4 hours or so. Added 15 carbs to lunch and dinner to counteract.  Mealtime insulin: 2 units with BF, lunch, dinner. No additional sliding scale needed as in the lower 100's.  Decreased lantus to 20 units 2 nights ago. Readings past 2 days - low Sunday am - in 90's to alert low.  Highest reading in upper 100 after dinner. 150 after breakfast today.   Currently 126 on CGM.  Microalbumin: ordered today.    Lab  Results  Component Value Date   HGBA1C 12.0 (H) 09/17/2021   Lab Results  Component Value Date   LDLCALC 72 12/02/2018   CREATININE 0.82 09/23/2021    History Patient Active Problem List   Diagnosis Date Noted   Newly diagnosed type 1 diabetes mellitus ([place]) 09/18/2021   Hypophosphatemia 09/18/2021   COVID-19 virus infection 09/18/2021   Diabetic ketoacidosis without coma associated with type 1 diabetes mellitus ([place]) 09/17/2021   Hypokalemia 09/17/2021   Genetic testing 05/31/2020   Family history of prostate cancer    Family history of gene mutation    Family history of breast cancer    GERD 01/20/2008   ECZEMA 01/20/2008   ELEVATED BLOOD PRESSURE WITHOUT DIAGNOSIS OF HYPERTENSION 01/20/2008   ACL TEAR 07/10/2006   Past Medical History:  Diagnosis Date   Family history of breast cancer    Family history of gene mutation    Family history of prostate cancer    Past Surgical History:  Procedure Laterality Date   BREAST CYST EXCISION Right    fatty tumor 2002   Allergies  Allergen Reactions   Tylenol [Acetaminophen] Palpitations   Metronidazole    Other    Prior to Admission medications   Medication Sig Start Date End Date Taking? Authorizing Provider  Continuous Blood Gluc Sensor (FREESTYLE LIBRE 3 SENSOR) MISC Place 1 sensor on the skin every 14 days. Use to check glucose continuously 09/23/21  Yes  Wendie Agreste, MD  Glucagon (BAQSIMI ONE PACK) 3 MG/DOSE POWD Place 3 mg into the nose every 15 (fifteen) minutes. 09/18/21  Yes Mercy Riding, MD  Ibuprofen-Acetaminophen (ADVIL DUAL ACTION PO) Take 1 tablet by mouth every 6 (six) hours as needed (pain).   Yes [provider]  insulin aspart (NOVOLOG) 100 UNIT/ML FlexPen Inject 4 units TID before meals plus sliding scale as below CBG < 70: Implement Hypoglycemia Standing Orders and refer to Hypoglycemia Standing Orders sidebar report CBG 70 - 120: 0 units CBG 121 - 150: 2 units CBG 151 - 200: 3 units CBG 201 -  250: 5 units CBG 251 - 300: 8 units CBG 301 - 350: 11 units CBG 351 - 400: 15 units CBG > 400: call MD and obtain STAT lab verification 09/18/21  Yes Mercy Riding, MD  Insulin Glargine (BASAGLAR KWIKPEN) 100 UNIT/ML Inject 25 Units into the skin daily. 09/18/21  Yes Mercy Riding, MD  Insulin Pen Needle (PEN NEEDLES 3/16") 31G X 5 MM MISC 1 Pen by Does not apply route 4 (four) times daily -  before meals and at bedtime. 09/18/21  Yes Mercy Riding, MD  phosphorus (K PHOS NEUTRAL) 155-852-130 MG tablet Take 2 tablets (500 mg total) by mouth 3 (three) times daily. Patient not taking: Reported on 09/23/2021 09/18/21   Mercy Riding, MD   Social History   Socioeconomic History   Marital status: Single    Spouse name: Not on file   Number of children: Not on file   Years of education: Not on file   Highest education level: Not on file  Occupational History   Not on file  Tobacco Use   Smoking status: Never   Smokeless tobacco: Never  Substance and Sexual Activity   Alcohol use: Yes    Alcohol/week: 2.0 standard drinks of alcohol    Types: 2 Glasses of wine per week   Drug use: Never   Sexual activity: Yes  Other Topics Concern   Not on file  Social History Narrative   Not on file   Social Determinants of Health   Financial Resource Strain: Not on file  Food Insecurity: Not on file  Transportation Needs: Not on file  Physical Activity: Not on file  Stress: Not on file  Social Connections: Not on file  Intimate Partner Violence: Not on file    Review of Systems Per HPI.   Objective:   Vitals:   10/07/21 1158  BP: 122/64  Pulse: 69  Resp: 16  Temp: 98.2 F (36.8 C)  TempSrc: Oral  SpO2: 98%  Weight: 159 lb 6.4 oz (72.3 kg)  Height: 5\' 2"  (1.575 m)     Physical Exam Constitutional:      General: She is not in acute distress.    Appearance: Normal appearance. She is well-developed.  HENT:     Head: Normocephalic and atraumatic.  Cardiovascular:     Rate and Rhythm:  Normal rate.  Pulmonary:     Effort: Pulmonary effort is normal.  Neurological:     Mental Status: She is alert and oriented to person, place, and time.  Psychiatric:        Mood and Affect: Mood normal.           Assessment & Plan:  Angelica Martinez is a 46 y.o. female . Newly diagnosed type 1 diabetes mellitus ([place]) - Plan: Microalbumin / creatinine urine ratio  Hypoglycemia Improved stability but still with  some symptomatic lows, and some of her improvement and stability is likely related to increased carbohydrate intake to help offset hypoglycemia.  Increased activity, exercise likely helping glycemic control as well.  With risks of hypoglycemia, I would rather see her running a little bit higher for now.   -Decrease Lantus to 18 units for now with update on readings in the next few days as we may need to decrease that further.  We will continue 2 units NovoLog with meals for now unless she has a lighter meal or lower readings prior to meal.  3-week in office follow-up but will adjust regimen potentially through MyChart with update in the next few days.  Endocrinology follow-up appointment planned.   No orders of the defined types were placed in this encounter.  Patient Instructions  Decrease lantus to 18 units. Ok to remain on same dose mealtime insulin unless lower readings or not eating full meal - skip mealtime insulin. If any further lows, we may need to decrease lantus further. Send me an update in next few days.    Insulin Injection Instructions, Single Insulin Dose, Adult There are many different types of insulin. The type of insulin that you take may determine how many injections you give yourself and when you need to give the injections. Supplies needed: Soap and water. Insulin medicine in small bottles (vials). A new, unused insulin syringe. Alcohol wipes. A disposal container for sharp items (sharps container), such as an empty plastic bottle with a cover. How  to choose a site for injection The body absorbs insulin differently, depending on where the insulin is injected (injection site). It is best to inject insulin into the same body area each time; for example, always in the abdomen. However, you should use a different spot in that area for each injection. Do not inject the insulin in the same spot each time. There are five main areas that can be used for injecting. These areas are: Abdomen. This is the preferred area. Front of thigh. Upper, outer side of thigh. Upper, outer side of arm. Upper, outer part of buttock. How to give a single-dose insulin injection Get ready Wash your hands with soap and water for at least 20 seconds. If soap and water are not available, use hand sanitizer. Test your blood sugar (glucose) level and write down that number. Follow any instructions from your health care provider about what to do if your blood glucose level is higher or lower than your normal range. Make sure the vial you are using has the right kind of insulin and there is enough insulin for your dose. Check the expiration date. Check to make sure you have the correct type of insulin syringe for the concentration of insulin that you are using. Use a new, unused insulin syringe each time you need to inject insulin. If you are using CLEAR insulin, check to see that it is clear and free of clumps. If you are using CLOUDY insulin, mix it by gently rolling the insulin vial between your palms several times. Do not shake the vial. Remove the plastic pop-top covering from the vial of insulin. This type of covering is present on a vial when it is new. Use an alcohol wipe to clean the rubber top of the vial. Remove the plastic cover from the syringe needle. Do not let the needle touch anything. Push air into the vial To bring (draw up) air into the syringe, slowly pull back on the syringe plunger. Stop pulling the  plunger when the dose indicator gets to the number of  units that you will be using. While you keep the vial right-side-up, poke the needle through the rubber top of the vial. Do not turn the vial upside down to do this. Push the plunger all the way into the syringe. Doing that will push air into the vial. Do not take the needle out of the vial yet. Fill the syringe  While the needle is still in the vial, turn the vial upside down and hold it at eye level. Slowly pull back on the plunger. Stop pulling the plunger when the dose indicator gets to the desired number of units. If you see air bubbles in the syringe, slowly move the plunger up and down 2 or 3 times to make them go away. If you had to move the plunger to get rid of air bubbles, pull back the plunger until the dose indicator returns to the correct dose. Remove the needle from the vial. Do not let the needle touch anything. Inject the insulin  Use an alcohol wipe to clean the site where you will be inserting the needle. Let the site air-dry. Hold the syringe in your writing hand like a pencil. If directed by your health care provider, use your other hand to pinch and hold about an inch (2.5 cm) of skin at the injection site. Do not directly touch the cleaned part of the skin. Gently but quickly, put the needle straight into the skin. The needle should be at a 45-degree angle or a 90-degree angle (perpendicular) to the skin, as told by your health care provider. Push the needle in as far as it will go (to the hub). When the needle is completely inserted into the skin, let go of the skin that you are pinching. Continue to hold the syringe in place with your writing hand. Use your thumb or index finger of your writing hand to push the plunger all the way into the syringe to inject the insulin. Wait 5-10 seconds, then pull the needle straight out of the skin. This will allow all of the insulin to go from the syringe and needle into your body. If bleeding occurs, press and hold gauze over the  injection site until any bleeding stops. Do not rub the area. Do not put the plastic cover back on the needle. Discard the syringe and needle directly into a sharps container. How to throw away supplies Discard all used needles in a sharps container. Follow the disposal regulations for the area where you live. Do not use any syringe or needle more than one time. Throw away empty vials in the regular trash. Questions to ask your health care provider How often should I be taking insulin? How often should I check my blood glucose? What amount of insulin should I be taking at each time? What are the side effects? What should I do if my blood glucose is too high? What should I do if my blood glucose is too low? What should I do if I forget to take my insulin? What number should I call if I have questions? Where to find more information American Diabetes Association (ADA): diabetes.org Association of Diabetes Care and Education Specialists (ADCES): diabeteseducator.org Summary Before you give yourself an insulin injection, be sure to wash your hands for at least 20 seconds and test your blood glucose level. Write down that number. Check the expiration date and the type of insulin that you are using. The type  of insulin that you take may determine how many injections you give yourself and when you need to give the injections. It is best to inject insulin into the same body area each time; for example, always in the abdomen. However, you should use a different spot in that area for each injection. Do not use an insulin syringe more than one time. This information is not intended to replace advice given to you by your health care provider. Make sure you discuss any questions you have with your health care provider. Document Revised: 04/23/2020 Document Reviewed: 12/03/2019 Elsevier Patient Education  [place],   Merri Ray, MD [place] 10/07/21 12:14 PM

## 2021-10-10 ENCOUNTER — Encounter: Payer: Self-pay | Admitting: Family Medicine

## 2021-10-28 ENCOUNTER — Encounter: Payer: Self-pay | Admitting: Family Medicine

## 2021-10-28 ENCOUNTER — Ambulatory Visit (INDEPENDENT_AMBULATORY_CARE_PROVIDER_SITE_OTHER): Payer: No Typology Code available for payment source | Admitting: Family Medicine

## 2021-10-28 VITALS — BP 122/74 | HR 79 | Temp 98.5°F | Ht 62.0 in | Wt 163.4 lb

## 2021-10-28 DIAGNOSIS — Z1211 Encounter for screening for malignant neoplasm of colon: Secondary | ICD-10-CM

## 2021-10-28 DIAGNOSIS — E109 Type 1 diabetes mellitus without complications: Secondary | ICD-10-CM

## 2021-10-28 DIAGNOSIS — E162 Hypoglycemia, unspecified: Secondary | ICD-10-CM | POA: Diagnosis not present

## 2021-10-28 MED ORDER — BAQSIMI ONE PACK 3 MG/DOSE NA POWD: 3.0000 mg | NASAL | 1 refills | Status: DC

## 2021-10-28 NOTE — Progress Notes (Signed)
Subjective:  Patient ID: Angelica Martinez, female    DOB: 1975/05/23  Age: 46 y.o. MRN: [id number]  CC:  Chief Complaint  Patient presents with   Diabetes    Pt states all is well, pt states she is still getting overnight lowes, pt wants to know if she needs to get another prescription of her nose spray because she forgot it in the car and it got really hot    HPI Angelica Martinez presents for   Diabetes: New diagnosis early August.  Type I based on low C-peptide, positive glutamic acid decarboxylase.  Referred to endocrinology.  Last visit August 21.  We have discussed some hypoglycemia previously, was having to adjust with juice, granola bars, and increased carbohydrates to accommodate.  Previously had decreased her Lantus to 22 units due to some of those lows, and remained on 2 units NovoLog with meals. With persistent lows at last visit I decreased her Lantus to 18 units daily, and to skip mealtime insulin if skipping or small meal.  She has been referred to endocrinology, plan to see Dr. Katrinka Blazing at [facility]- appt Tuesday 19th.   Now on 16 units lantus past 10 days as still some nighttime lows. Has noticed that she has to wait for a few hours after mealtime insulin to keep from dropping too low. Still on 2 units novolog. Only used additional sliding scale 3 times since last visit.  Home readings by CGM - 7 day avg: 97-143.   2 hour postprandial around  Eating every 4 hours.  Still lows at night - still with nightly lows. Blood sugar dropping at 2 am and 6 am at times if not eating late night meals.  Needs reorder of Baqsimi glucagon poder - was left in car - unsure of temp. Water quality scientist - says up to 86degrees). Has not had to use.  Able to run a mile and getting back into exercise - 15 carbs w/in 15 min of exercise, and 15 more carbs in recovery meal.  Dizzy at times - notes at times of transitions to higher or lower readings.  Saw optho after last visit - recheck in  71months. No sign of retinopathy.   Lab Results  Component Value Date   HGBA1C 12.0 (H) 09/17/2021   Lab Results  Component Value Date   MICROALBUR <0.7 10/07/2021   LDLCALC 72 12/02/2018   CREATININE 0.82 09/23/2021       History Patient Active Problem List   Diagnosis Date Noted   Newly diagnosed type 1 diabetes mellitus ([facility]) 09/18/2021   Hypophosphatemia 09/18/2021   COVID-19 virus infection 09/18/2021   Diabetic ketoacidosis without coma associated with type 1 diabetes mellitus ([facility]) 09/17/2021   Hypokalemia 09/17/2021   Genetic testing 05/31/2020   Family history of prostate cancer    Family history of gene mutation    Family history of breast cancer    GERD 01/20/2008   ECZEMA 01/20/2008   ELEVATED BLOOD PRESSURE WITHOUT DIAGNOSIS OF HYPERTENSION 01/20/2008   ACL TEAR 07/10/2006   Past Medical History:  Diagnosis Date   Family history of breast cancer    Family history of gene mutation    Family history of prostate cancer    Past Surgical History:  Procedure Laterality Date   BREAST CYST EXCISION Right    fatty tumor 2002   Allergies  Allergen Reactions   Tylenol [Acetaminophen] Palpitations   Metronidazole    Other    Prior to Admission medications  Medication Sig Start Date End Date Taking? Authorizing Provider  Continuous Blood Gluc Sensor (FREESTYLE LIBRE 3 SENSOR) MISC Place 1 sensor on the skin every 14 days. Use to check glucose continuously 09/23/21  Yes Shade Flood, MD  Glucagon (BAQSIMI ONE PACK) 3 MG/DOSE POWD Place 3 mg into the nose every 15 (fifteen) minutes. 09/18/21  Yes Almon Hercules, MD  Ibuprofen-Acetaminophen (ADVIL DUAL ACTION PO) Take 1 tablet by mouth every 6 (six) hours as needed (pain).   Yes [provider]  insulin aspart (NOVOLOG) 100 UNIT/ML FlexPen Inject 4 units TID before meals plus sliding scale as below CBG < 70: Implement Hypoglycemia Standing Orders and refer to Hypoglycemia Standing Orders sidebar report  CBG 70 - 120: 0 units CBG 121 - 150: 2 units CBG 151 - 200: 3 units CBG 201 - 250: 5 units CBG 251 - 300: 8 units CBG 301 - 350: 11 units CBG 351 - 400: 15 units CBG > 400: call MD and obtain STAT lab verification 09/18/21  Yes Almon Hercules, MD  Insulin Glargine (BASAGLAR KWIKPEN) 100 UNIT/ML Inject 25 Units into the skin daily. 09/18/21  Yes Almon Hercules, MD  Insulin Pen Needle (PEN NEEDLES 3/16") 31G X 5 MM MISC 1 Pen by Does not apply route 4 (four) times daily -  before meals and at bedtime. 09/18/21  Yes Almon Hercules, MD  phosphorus (K PHOS NEUTRAL) 155-852-130 MG tablet Take 2 tablets (500 mg total) by mouth 3 (three) times daily. 09/18/21   Almon Hercules, MD   Social History   Socioeconomic History   Marital status: Single    Spouse name: Not on file   Number of children: Not on file   Years of education: Not on file   Highest education level: Not on file  Occupational History   Not on file  Tobacco Use   Smoking status: Never   Smokeless tobacco: Never  Substance and Sexual Activity   Alcohol use: Yes    Alcohol/week: 2.0 standard drinks of alcohol    Types: 2 Glasses of wine per week   Drug use: Never   Sexual activity: Yes  Other Topics Concern   Not on file  Social History Narrative   Not on file   Social Determinants of Health   Financial Resource Strain: Not on file  Food Insecurity: Not on file  Transportation Needs: Not on file  Physical Activity: Not on file  Stress: Not on file  Social Connections: Not on file  Intimate Partner Violence: Not on file    Review of Systems Per hpi.   Objective:   Vitals:   10/28/21 1531  BP: 122/74  Pulse: 79  Temp: 98.5 F (36.9 C)  SpO2: 99%  Weight: 163 lb 6.4 oz (74.1 kg)  Height: 5\' 2"  (1.575 m)     Physical Exam Vitals reviewed.  Constitutional:      Appearance: Normal appearance. She is well-developed.  HENT:     Head: Normocephalic and atraumatic.  Eyes:     Conjunctiva/sclera: Conjunctivae normal.      Pupils: Pupils are equal, round, and reactive to light.  Neck:     Vascular: No carotid bruit.  Cardiovascular:     Rate and Rhythm: Normal rate and regular rhythm.     Heart sounds: Normal heart sounds.  Pulmonary:     Effort: Pulmonary effort is normal.     Breath sounds: Normal breath sounds.  Abdominal:  Palpations: Abdomen is soft. There is no pulsatile mass.     Tenderness: There is no abdominal tenderness.  Musculoskeletal:     Right lower leg: No edema.     Left lower leg: No edema.  Skin:    General: Skin is warm and dry.  Neurological:     General: No focal deficit present.     Mental Status: She is alert and oriented to person, place, and time. Mental status is at baseline.     Motor: No weakness.     Coordination: Coordination normal.     Gait: Gait normal.  Psychiatric:        Mood and Affect: Mood normal.        Behavior: Behavior normal.        Assessment & Plan:  Angelica Martinez is a 46 y.o. female . Newly diagnosed type 1 diabetes mellitus ([facility]) Hypoglycemia - Plan: Glucagon (BAQSIMI ONE PACK) 3 MG/DOSE POWD  -Unfortunately still some hypoglycemia, lower readings, and need for additional carbohydrate intake.  Still suspect overcontrolled.  We will decrease her long-acting insulin by 4 units at this time, with close monitoring and potential further decreases.  No change in mealtime insulin for now.  Refilled glucagon nasal spray if needed for severe hypoglycemia.  Keep follow-up as planned with endocrinology.  Update by MyChart next few days for further changes if needed.  -Episodic lightheadedness likely related to fluctuations in blood sugar, nonfocal neuro exam in office.  RTC precautions if persistent or worsening.  Anticipate improvement with lessening of hypoglycemia  Screen for colon cancer  -Cologuard order planned as long as she does not have family history of colon cancer.  She will let me know by MyChart, so order can be placed.  Meds ordered  this encounter  Medications   Glucagon (BAQSIMI ONE PACK) 3 MG/DOSE POWD    Sig: Place 3 mg into the nose every 15 (fifteen) minutes. If needed for severe hypoglycemia.    Dispense:  1 each    Refill:  1   Patient Instructions  Decrease lantus to 12 units for now. If any further lows, we will need to decrease further - keep me posted. Let me know if any family history of colon cancer.  If not, I can order the Cologuard as we discussed.  Schedule physical in the next few months, but let me know if there are questions in the meantime.  I will watch for the notes from Dr. Katrinka Blazing.  Take care.    Signed,   Meredith Staggers, MD [facility], [facility] 10/28/21 4:12 PM

## 2021-10-28 NOTE — Patient Instructions (Addendum)
Decrease lantus to 12 units for now. If any further lows, we will need to decrease further - keep me posted. Let me know if any family history of colon cancer.  If not, I can order the Cologuard as we discussed.  Schedule physical in the next few months, but let me know if there are questions in the meantime.  I will watch for the notes from Dr. Katrinka Blazing.  Take care.

## 2021-11-04 ENCOUNTER — Other Ambulatory Visit (HOSPITAL_COMMUNITY): Payer: Self-pay | Admitting: Obstetrics & Gynecology

## 2021-11-04 DIAGNOSIS — Z803 Family history of malignant neoplasm of breast: Secondary | ICD-10-CM

## 2021-12-09 ENCOUNTER — Encounter: Payer: Self-pay | Admitting: Family Medicine

## 2021-12-09 DIAGNOSIS — E109 Type 1 diabetes mellitus without complications: Secondary | ICD-10-CM

## 2021-12-09 DIAGNOSIS — Z1211 Encounter for screening for malignant neoplasm of colon: Secondary | ICD-10-CM

## 2021-12-10 MED ORDER — PEN NEEDLES 3/16" 31G X 5 MM MISC
1.0000 | Freq: Three times a day (TID) | 1 refills | Status: AC
Start: 2021-12-10 — End: ?

## 2022-01-17 ENCOUNTER — Other Ambulatory Visit (HOSPITAL_COMMUNITY): Payer: Self-pay

## 2022-01-17 ENCOUNTER — Other Ambulatory Visit (HOSPITAL_BASED_OUTPATIENT_CLINIC_OR_DEPARTMENT_OTHER): Payer: Self-pay

## 2022-01-17 MED ORDER — COVID-19 MRNA VAC-TRIS(PFIZER) 30 MCG/0.3ML IM SUSY
PREFILLED_SYRINGE | INTRAMUSCULAR | 0 refills | Status: DC
  Filled 2022-01-17 (×2): qty 0.3, 1d supply, fill #0

## 2022-01-20 ENCOUNTER — Other Ambulatory Visit (HOSPITAL_BASED_OUTPATIENT_CLINIC_OR_DEPARTMENT_OTHER): Payer: Self-pay

## 2022-01-27 ENCOUNTER — Other Ambulatory Visit (HOSPITAL_BASED_OUTPATIENT_CLINIC_OR_DEPARTMENT_OTHER): Payer: Self-pay

## 2022-01-29 ENCOUNTER — Other Ambulatory Visit (HOSPITAL_BASED_OUTPATIENT_CLINIC_OR_DEPARTMENT_OTHER): Payer: Self-pay

## 2022-01-30 ENCOUNTER — Other Ambulatory Visit (HOSPITAL_BASED_OUTPATIENT_CLINIC_OR_DEPARTMENT_OTHER): Payer: Self-pay

## 2022-02-28 ENCOUNTER — Ambulatory Visit (INDEPENDENT_AMBULATORY_CARE_PROVIDER_SITE_OTHER): Payer: No Typology Code available for payment source | Admitting: Family Medicine

## 2022-02-28 ENCOUNTER — Encounter: Payer: Self-pay | Admitting: Family Medicine

## 2022-02-28 DIAGNOSIS — J029 Acute pharyngitis, unspecified: Secondary | ICD-10-CM

## 2022-02-28 LAB — POCT RAPID STREP A (OFFICE): Rapid Strep A Screen: NEGATIVE

## 2022-02-28 NOTE — Progress Notes (Signed)
Subjective:  Patient ID: Angelica Martinez, female    DOB: 04/14/1975  Age: 47 y.o. MRN: [id number]  CC:  Chief Complaint  Patient presents with   Cough    Pt notes she had cough, and congestion started last night notes soreness in her chest, some sore throat, headache, Neg Covid this am     HPI [name] presents for   Cough: Felt flushed after work yesterday, slight HA, maybe not drinking enough fluids. tired but typical of usual rest day. Went to bed earlier last night, woke up with sore throat, flushed this am.  Covid test negative yesterday afternoon, and again this am.  Sick contacts at work. 1 coworker with covid, no known exposure. Strep in office as well.  Eating and drinking ok, swallowing ok. Min sore sore throat.  Min cough this am.  No measured fever.  Increased stress at work, overwhelmed recently. Has discussed with admin - helping to offload. Transitioned to inluin pump last month, variable readings with some symptoms at times as well. Hx of IDDM, under care of endocrinology - Dr. Katrinka Blazing at [facility], A1c 6.1 February 05, 2022. Happy with care with endocrinologist. Rare sx lows.   History Patient Active Problem List   Diagnosis Date Noted   Newly diagnosed type 1 diabetes mellitus (HCC) 09/18/2021   Hypophosphatemia 09/18/2021   COVID-19 virus infection 09/18/2021   Diabetic ketoacidosis without coma associated with type 1 diabetes mellitus (HCC) 09/17/2021   Hypokalemia 09/17/2021   Genetic testing 05/31/2020   Family history of prostate cancer    Family history of gene mutation    Family history of breast cancer    GERD 01/20/2008   ECZEMA 01/20/2008   ELEVATED BLOOD PRESSURE WITHOUT DIAGNOSIS OF HYPERTENSION 01/20/2008   ACL TEAR 07/10/2006   Past Medical History:  Diagnosis Date   Family history of breast cancer    Family history of gene mutation    Family history of prostate cancer    Past Surgical History:   Procedure Laterality Date   BREAST CYST EXCISION Right    fatty tumor 2002   Allergies  Allergen Reactions   Tylenol [Acetaminophen] Palpitations   Metronidazole    Other    Prior to Admission medications   Medication Sig Start Date End Date Taking? Authorizing Provider  Continuous Blood Gluc Transmit (DEXCOM G6 TRANSMITTER) MISC USE AS DIRECTED FOR CONTINUOUS GLUCOSE MONITORING, REUSE TRANSMITTER THEN REPLACE   Yes [provider]  Glucagon (BAQSIMI ONE PACK) 3 MG/DOSE POWD Place 3 mg into the nose every 15 (fifteen) minutes. If needed for severe hypoglycemia. 10/28/21  Yes Shade Flood, MD  Ibuprofen-Acetaminophen (ADVIL DUAL ACTION PO) Take 1 tablet by mouth every 6 (six) hours as needed (pain).   Yes [provider]  Insulin Disposable Pump (OMNIPOD 5 G6 POD, GEN 5,) MISC Use one pod every 72 hours hours for insulin delivery. 12/10/21 12/10/22 Yes [provider]  Insulin Pen Needle (PEN NEEDLES 3/16") 31G X 5 MM MISC 1 Pen by Does not apply route 4 (four) times daily -  before meals and at bedtime. 12/10/21  Yes Shade Flood, MD  Continuous Blood Gluc Sensor (FREESTYLE LIBRE 3 SENSOR) MISC Place 1 sensor on the skin every 14 days. Use to check glucose continuously 09/23/21   Shade Flood, MD  insulin aspart (NOVOLOG) 100 UNIT/ML FlexPen Inject 4 units TID before meals plus sliding scale as below CBG < 70: Implement Hypoglycemia  Standing Orders and refer to Hypoglycemia Standing Orders sidebar report CBG 70 - 120: 0 units CBG 121 - 150: 2 units CBG 151 - 200: 3 units CBG 201 - 250: 5 units CBG 251 - 300: 8 units CBG 301 - 350: 11 units CBG 351 - 400: 15 units CBG > 400: call MD and obtain STAT lab verification 09/18/21   Mercy Riding, MD  Insulin Glargine (BASAGLAR KWIKPEN) 100 UNIT/ML Inject 25 Units into the skin daily. Patient not taking: Reported on 02/28/2022 09/18/21   Mercy Riding, MD   Social History   Socioeconomic History   Marital status:  Single    Spouse name: Not on file   Number of children: Not on file   Years of education: Not on file   Highest education level: Not on file  Occupational History   Not on file  Tobacco Use   Smoking status: Never   Smokeless tobacco: Never  Substance and Sexual Activity   Alcohol use: Yes    Alcohol/week: 2.0 standard drinks of alcohol    Types: 2 Glasses of wine per week   Drug use: Never   Sexual activity: Yes  Other Topics Concern   Not on file  Social History Narrative   Not on file   Social Determinants of Health   Financial Resource Strain: Not on file  Food Insecurity: Not on file  Transportation Needs: Not on file  Physical Activity: Not on file  Stress: Not on file  Social Connections: Not on file  Intimate Partner Violence: Not on file    Review of Systems   Objective:  There were no vitals filed for this visit.   Physical Exam Vitals reviewed.  Constitutional:      General: She is not in acute distress.    Appearance: She is well-developed.  HENT:     Head: Normocephalic and atraumatic.     Right Ear: Hearing, tympanic membrane, ear canal and external ear normal.     Left Ear: Hearing, tympanic membrane, ear canal and external ear normal.     Nose: Nose normal.     Mouth/Throat:     Pharynx: No posterior oropharyngeal erythema.  Eyes:     Conjunctiva/sclera: Conjunctivae normal.     Pupils: Pupils are equal, round, and reactive to light.  Cardiovascular:     Rate and Rhythm: Normal rate and regular rhythm.     Heart sounds: Normal heart sounds. No murmur heard. Pulmonary:     Effort: Pulmonary effort is normal. No respiratory distress.     Breath sounds: Normal breath sounds. No stridor. No wheezing, rhonchi or rales.     Comments: Faint coarse sound on left mid, improved with cough.  Skin:    General: Skin is warm and dry.     Findings: No rash.  Neurological:     Mental Status: She is alert and oriented to person, place, and time.   Psychiatric:        Mood and Affect: Mood normal.        Behavior: Behavior normal.    Results for orders placed or performed in visit on 02/28/22  POCT rapid strep A  Result Value Ref Range   Rapid Strep A Screen Negative Negative   Assessment & Plan:  SATIVA GELLES is a 46 y.o. female . Sore throat - Plan: POCT rapid strep A Likely viral syndrome.  Overall reassuring exam.  Symptomatic care discussed for now, with update on symptoms over  the weekend.  If fever/body ache and negative COVID testing, consider Tamiflu for influenza with potential side effects discussed.   Situational stressors with plan as above, should see some improvement.  RTC precautions.  No orders of the defined types were placed in this encounter.  Patient Instructions  Sore throat may be early viral illness.  If you do have fever, body aches, worsening symptoms tomorrow let me know and could consider Tamiflu for possible influenza.  Would also recommend rechecking COVID test again this weekend, let me know if that turns positive.  Lozenges may be helpful for sore throat care, Tylenol or Advil if needed.  Fluids, rest.  Let me know if there are questions and hope you feel better soon.  Sore Throat A sore throat is pain, burning, irritation, or scratchiness in the throat. When you have a sore throat, you may feel pain or tenderness in your throat when you swallow or talk. Many things can cause a sore throat, including: An infection. Seasonal allergies. Dryness in the air. Irritants, such as smoke or pollution. Radiation treatment for cancer. Gastroesophageal reflux disease (GERD). A tumor. A sore throat is often the first sign of another sickness. It may happen with other symptoms, such as coughing, sneezing, fever, and swollen neck glands. Most sore throats go away without medical treatment. Follow these instructions at home:     Medicines Take over-the-counter and prescription medicines only as told  by your health care provider. Children often get sore throats. Do not give your child aspirin because of the association with Reye's syndrome. Use throat sprays to soothe your throat as told by your health care provider. Managing pain To help with pain, try: Sipping warm liquids, such as broth, herbal tea, or warm water. Eating or drinking cold or frozen liquids, such as frozen ice pops. Gargling with a mixture of salt and water 3-4 times a day or as needed. To make salt water, completely dissolve -1 tsp (3-6 g) of salt in 1 cup (237 mL) of warm water. Sucking on hard candy or throat lozenges. Putting a cool-mist humidifier in your bedroom at night to moisten the air. Sitting in the bathroom with the door closed for 5-10 minutes while you run hot water in the shower. General instructions Do not use any products that contain nicotine or tobacco. These products include cigarettes, chewing tobacco, and vaping devices, such as e-cigarettes. If you need help quitting, ask your health care provider. Rest as needed. Drink enough fluid to keep your urine pale yellow. Wash your hands often with soap and water for at least 20 seconds. If soap and water are not available, use hand sanitizer. Contact a health care provider if: You have a fever for more than 2-3 days. You have symptoms that last for more than 2-3 days. Your throat does not get better within 7 days. You have a fever and your symptoms suddenly get worse. Get help right away if: You have difficulty breathing. You cannot swallow fluids, soft foods, or your saliva. You have increased swelling in your throat or neck. You have persistent nausea and vomiting. These symptoms may represent a serious problem that is an emergency. Do not wait to see if the symptoms will go away. Get medical help right away. Call your local emergency services (911 in the [place] Do not drive yourself to the hospital. Summary A sore throat is pain, burning,  irritation, or scratchiness in the throat. Many things can cause a sore throat. Take over-the-counter medicines  only as told by your health care provider. Rest as needed. Drink enough fluid to keep your urine pale yellow. Contact a health care provider if your throat does not get better within 7 days. This information is not intended to replace advice given to you by your health care provider. Make sure you discuss any questions you have with your health care provider. Document Revised: 05/02/2020 Document Reviewed: 05/02/2020 Elsevier Patient Education  2023 Elsevier Inc.     Signed,   Meredith Staggers, MD [facility], [facility] 02/28/22 3:44 PM

## 2022-02-28 NOTE — Patient Instructions (Addendum)
Sore throat may be early viral illness.  If you do have fever, body aches, worsening symptoms tomorrow let me know and could consider Tamiflu for possible influenza.  Would also recommend rechecking COVID test again this weekend, let me know if that turns positive.  Lozenges may be helpful for sore throat care, Tylenol or Advil if needed.  Fluids, rest.  Let me know if there are questions and hope you feel better soon.  Sore Throat A sore throat is pain, burning, irritation, or scratchiness in the throat. When you have a sore throat, you may feel pain or tenderness in your throat when you swallow or talk. Many things can cause a sore throat, including: An infection. Seasonal allergies. Dryness in the air. Irritants, such as smoke or pollution. Radiation treatment for cancer. Gastroesophageal reflux disease (GERD). A tumor. A sore throat is often the first sign of another sickness. It may happen with other symptoms, such as coughing, sneezing, fever, and swollen neck glands. Most sore throats go away without medical treatment. Follow these instructions at home:     Medicines Take over-the-counter and prescription medicines only as told by your health care provider. Children often get sore throats. Do not give your child aspirin because of the association with Reye's syndrome. Use throat sprays to soothe your throat as told by your health care provider. Managing pain To help with pain, try: Sipping warm liquids, such as broth, herbal tea, or warm water. Eating or drinking cold or frozen liquids, such as frozen ice pops. Gargling with a mixture of salt and water 3-4 times a day or as needed. To make salt water, completely dissolve -1 tsp (3-6 g) of salt in 1 cup (237 mL) of warm water. Sucking on hard candy or throat lozenges. Putting a cool-mist humidifier in your bedroom at night to moisten the air. Sitting in the bathroom with the door closed for 5-10 minutes while you run hot water in  the shower. General instructions Do not use any products that contain nicotine or tobacco. These products include cigarettes, chewing tobacco, and vaping devices, such as e-cigarettes. If you need help quitting, ask your health care provider. Rest as needed. Drink enough fluid to keep your urine pale yellow. Wash your hands often with soap and water for at least 20 seconds. If soap and water are not available, use hand sanitizer. Contact a health care provider if: You have a fever for more than 2-3 days. You have symptoms that last for more than 2-3 days. Your throat does not get better within 7 days. You have a fever and your symptoms suddenly get worse. Get help right away if: You have difficulty breathing. You cannot swallow fluids, soft foods, or your saliva. You have increased swelling in your throat or neck. You have persistent nausea and vomiting. These symptoms may represent a serious problem that is an emergency. Do not wait to see if the symptoms will go away. Get medical help right away. Call your local emergency services (911 in the [place] Do not drive yourself to the hospital. Summary A sore throat is pain, burning, irritation, or scratchiness in the throat. Many things can cause a sore throat. Take over-the-counter medicines only as told by your health care provider. Rest as needed. Drink enough fluid to keep your urine pale yellow. Contact a health care provider if your throat does not get better within 7 days. This information is not intended to replace advice given to you by your health care provider.  Make sure you discuss any questions you have with your health care provider. Document Revised: 05/02/2020 Document Reviewed: 05/02/2020 Elsevier Patient Education  [place].

## 2022-03-11 ENCOUNTER — Other Ambulatory Visit: Payer: Self-pay | Admitting: Family Medicine

## 2022-03-11 DIAGNOSIS — Z8619 Personal history of other infectious and parasitic diseases: Secondary | ICD-10-CM

## 2022-03-11 DIAGNOSIS — J069 Acute upper respiratory infection, unspecified: Secondary | ICD-10-CM

## 2022-03-11 DIAGNOSIS — J019 Acute sinusitis, unspecified: Secondary | ICD-10-CM

## 2022-03-11 MED ORDER — FLUCONAZOLE 150 MG PO TABS: 150.0000 mg | ORAL_TABLET | Freq: Once | ORAL | 0 refills | Status: AC

## 2022-03-11 MED ORDER — AMOXICILLIN-POT CLAVULANATE 875-125 MG PO TABS: 1.0000 | ORAL_TABLET | Freq: Two times a day (BID) | ORAL | 0 refills | Status: DC

## 2022-03-11 MED ORDER — FLUCONAZOLE 150 MG PO TABS: 150.0000 mg | ORAL_TABLET | Freq: Once | ORAL | 0 refills | Status: DC

## 2022-03-11 NOTE — Progress Notes (Signed)
Spoke with patient last night.  Symptoms have persisted since her last visit.  Some chest congestion but primarily nasal congestion, pressure, some pressure/congestion feeling in ears, and frontal headache.  No fevers, no dyspnea.  Some persistent fatigue.  Slight discoloration to nasal discharge.  Possible slight improvement with use of DayQuil.  Discussed possible early sinus infection, but also risks and side effects with antibiotics.  Will try saline nasal spray.  Continue DayQuil for another day or 2 and if not improving can start Augmentin, with Diflucan given if needed for yeast infection.  RTC/ER precautions given.  Side effects discussed of antibiotics.

## 2022-03-11 NOTE — Progress Notes (Signed)
Initial order failed. Reordered and also failed. Called into pharmacy.

## 2022-04-29 ENCOUNTER — Telehealth: Payer: Self-pay

## 2022-04-29 NOTE — Telephone Encounter (Signed)
Called to find out if patient has had a PAP smear or mammogram in last 2 years and if so where they had them so we may request records

## 2022-05-01 ENCOUNTER — Other Ambulatory Visit: Payer: Self-pay

## 2022-08-08 ENCOUNTER — Other Ambulatory Visit: Payer: Self-pay | Admitting: Family Medicine

## 2022-08-08 DIAGNOSIS — Z1231 Encounter for screening mammogram for malignant neoplasm of breast: Secondary | ICD-10-CM

## 2022-08-14 ENCOUNTER — Encounter: Payer: No Typology Code available for payment source | Admitting: Family Medicine

## 2022-08-28 ENCOUNTER — Encounter: Payer: No Typology Code available for payment source | Admitting: Family Medicine

## 2022-08-29 ENCOUNTER — Ambulatory Visit: Payer: No Typology Code available for payment source

## 2022-09-01 ENCOUNTER — Ambulatory Visit
Admission: RE | Admit: 2022-09-01 | Discharge: 2022-09-01 | Disposition: A | Discharge status: Home / Self Care | Payer: No Typology Code available for payment source | Source: Ambulatory Visit | Attending: Family Medicine | Admitting: Family Medicine

## 2022-09-01 DIAGNOSIS — Z1231 Encounter for screening mammogram for malignant neoplasm of breast: Secondary | ICD-10-CM

## 2022-09-25 ENCOUNTER — Encounter: Payer: No Typology Code available for payment source | Admitting: Family Medicine

## 2022-09-29 ENCOUNTER — Encounter: Payer: Self-pay | Admitting: Family Medicine

## 2022-09-29 ENCOUNTER — Ambulatory Visit (INDEPENDENT_AMBULATORY_CARE_PROVIDER_SITE_OTHER): Payer: No Typology Code available for payment source | Admitting: Family Medicine

## 2022-09-29 VITALS — BP 118/68 | HR 85 | Temp 98.7°F | Ht 62.0 in | Wt 149.0 lb

## 2022-09-29 DIAGNOSIS — Z13220 Encounter for screening for lipoid disorders: Secondary | ICD-10-CM

## 2022-09-29 DIAGNOSIS — Z130 Encounter for screening for diseases of the blood and blood-forming organs and certain disorders involving the immune mechanism: Secondary | ICD-10-CM | POA: Diagnosis not present

## 2022-09-29 DIAGNOSIS — E10649 Type 1 diabetes mellitus with hypoglycemia without coma: Secondary | ICD-10-CM

## 2022-09-29 DIAGNOSIS — Z0000 Encounter for general adult medical examination without abnormal findings: Secondary | ICD-10-CM

## 2022-09-29 DIAGNOSIS — Z1159 Encounter for screening for other viral diseases: Secondary | ICD-10-CM

## 2022-09-29 NOTE — Patient Instructions (Addendum)
Thanks for coming in today.  If any concerns on labs I will let you know. They may be available tomorrow to review at endocrinology.  I would consider a breast MRI but can be discussed with your gynecologist at upcoming appointment.  Covid booster with flu vaccine when available.  Let me know where you would like to be referred for colonoscopy and I can place that referral. Let me know if there are questions. Take care.   Preventive Care 9-47 Years Old, Female Preventive care refers to lifestyle choices and visits with your health care provider that can promote health and wellness. Preventive care visits are also called wellness exams. What can I expect for my preventive care visit? Counseling Your health care provider may ask you questions about your: Medical history, including: Past medical problems. Family medical history. Pregnancy history. Current health, including: Menstrual cycle. Method of birth control. Emotional well-being. Home life and relationship well-being. Sexual activity and sexual health. Lifestyle, including: Alcohol, nicotine or tobacco, and drug use. Access to firearms. Diet, exercise, and sleep habits. Work and work Astronomer. Sunscreen use. Safety issues such as seatbelt and bike helmet use. Physical exam Your health care provider will check your: Height and weight. These may be used to calculate your BMI (body mass index). BMI is a measurement that tells if you are at a healthy weight. Waist circumference. This measures the distance around your waistline. This measurement also tells if you are at a healthy weight and may help predict your risk of certain diseases, such as type 2 diabetes and high blood pressure. Heart rate and blood pressure. Body temperature. Skin for abnormal spots. What immunizations do I need?  Vaccines are usually given at various ages, according to a schedule. Your health care provider will recommend vaccines for you based on  your age, medical history, and lifestyle or other factors, such as travel or where you work. What tests do I need? Screening Your health care provider may recommend screening tests for certain conditions. This may include: Lipid and cholesterol levels. Diabetes screening. This is done by checking your blood sugar (glucose) after you have not eaten for a while (fasting). Pelvic exam and Pap test. Hepatitis B test. Hepatitis C test. HIV (human immunodeficiency virus) test. STI (sexually transmitted infection) testing, if you are at risk. Lung cancer screening. Colorectal cancer screening. Mammogram. Talk with your health care provider about when you should start having regular mammograms. This may depend on whether you have a family history of breast cancer. BRCA-related cancer screening. This may be done if you have a family history of breast, ovarian, tubal, or peritoneal cancers. Bone density scan. This is done to screen for osteoporosis. Talk with your health care provider about your test results, treatment options, and if necessary, the need for more tests. Follow these instructions at home: Eating and drinking  Eat a diet that includes fresh fruits and vegetables, whole grains, lean protein, and low-fat dairy products. Take vitamin and mineral supplements as recommended by your health care provider. Do not drink alcohol if: Your health care provider tells you not to drink. You are pregnant, may be pregnant, or are planning to become pregnant. If you drink alcohol: Limit how much you have to 0-1 drink a day. Know how much alcohol is in your drink. In the [place] one drink equals one 12 oz bottle of beer (355 mL), one 5 oz glass of wine (148 mL), or one 1 oz glass of hard liquor (44 mL). Lifestyle  Brush your teeth every morning and night with fluoride toothpaste. Floss one time each day. Exercise for at least 30 minutes 5 or more days each week. Do not use any products that contain  nicotine or tobacco. These products include cigarettes, chewing tobacco, and vaping devices, such as e-cigarettes. If you need help quitting, ask your health care provider. Do not use drugs. If you are sexually active, practice safe sex. Use a condom or other form of protection to prevent STIs. If you do not wish to become pregnant, use a form of birth control. If you plan to become pregnant, see your health care provider for a prepregnancy visit. Take aspirin only as told by your health care provider. Make sure that you understand how much to take and what form to take. Work with your health care provider to find out whether it is safe and beneficial for you to take aspirin daily. Find healthy ways to manage stress, such as: Meditation, yoga, or listening to music. Journaling. Talking to a trusted person. Spending time with friends and family. Minimize exposure to UV radiation to reduce your risk of skin cancer. Safety Always wear your seat belt while driving or riding in a vehicle. Do not drive: If you have been drinking alcohol. Do not ride with someone who has been drinking. When you are tired or distracted. While texting. If you have been using any mind-altering substances or drugs. Wear a helmet and other protective equipment during sports activities. If you have firearms in your house, make sure you follow all gun safety procedures. Seek help if you have been physically or sexually abused. What's next? Visit your health care provider once a year for an annual wellness visit. Ask your health care provider how often you should have your eyes and teeth checked. Stay up to date on all vaccines. This information is not intended to replace advice given to you by your health care provider. Make sure you discuss any questions you have with your health care provider. Document Revised: 08/01/2020 Document Reviewed: 08/01/2020 Elsevier Patient Education  2024 [employer].

## 2022-09-29 NOTE — Progress Notes (Signed)
Subjective:  Patient ID: Angelica Martinez, female    DOB: [birth year]  Age: 47 y.o. MRN: [id number]  CC:  Chief Complaint  Patient presents with   Annual Exam    Pt is doing well, new GI referral but would like to send a message to [place] with a Dr office she would like     HPI Angelica Martinez presents for Annual Exam  Endocrinology, Dr. Corwin [facility], type 1 diabetes with hyperglycemia, initially seen in September 2023.  Diabetes diagnosis in August 2023 with A1c of 11.2.  On insulin pump -OmniPod 5 with Dexcom.  ICR 1:9.  Office visit reviewed from March, A1c of 6.3.  Minimal lows, and aware of treatment.  Appointment tomorrow with endocrinology. Some issues with sensor, that affected pumps recently - better now.   Gynecology, Dr. Juliene Pina, appointment in September with referral for mammogram at that time. Appt next month. Last pap in 2022.   Family history of breast cancer -older sister diagnosed in 4786 at 68 years old.  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 Mammogram without evidence of malignancy on 09/01/2022.     09/29/2022    4:01 PM 02/28/2022   12:01 PM 10/28/2021    3:28 PM 09/23/2021    2:09 PM 07/12/2020    9:26 AM  Depression screen PHQ 2/9  Decreased Interest 0 0 0 0 0  Down, Depressed, Hopeless 0 0 0 0 0  PHQ - 2 Score 0 0 0 0 0  Altered sleeping 0 0 0 0 0  Tired, decreased energy 0 0 0 0 0  Change in appetite 0 0 0 0 0  Feeling bad or failure about yourself  0 0 0 0 0  Trouble concentrating 0 0 0 0 0  Moving slowly or fidgety/restless 0 0 0 0 0  Suicidal thoughts 0 0 0 0 0  PHQ-9 Score 0 0 0 0 0  Some stress with increasing covid numbers, managing with exercise.      09/29/2022    4:02 PM 07/12/2020    9:27 AM  GAD 7 : Generalized Anxiety Score  Nervous, Anxious, on Edge 0 0  Control/stop worrying 0 0  Worry too much  - different things 0 0  Trouble relaxing 0 0  Restless 0 0  Easily annoyed or irritable 0 0  Afraid - awful might happen 0 0  Total GAD 7 Score 0 0    Health Maintenance  Topic Date Due   FOOT EXAM  Never done   Hepatitis C Screening  Never done   Colonoscopy  Never done   HEMOGLOBIN A1C  03/20/2022   Diabetic kidney evaluation - eGFR measurement  09/24/2022   INFLUENZA VACCINE  09/18/2022   Diabetic kidney evaluation - Urine ACR  10/08/2022   OPHTHALMOLOGY EXAM  10/08/2022   PAP SMEAR-Modifier  10/26/2023   DTaP/Tdap/Td (3 - Td or Tdap) 12/01/2028   COVID-19 Vaccine  Completed   HIV Screening  Completed   HPV VACCINES  Aged Out  Colon cancer screening- no prior testing. Screening options with colonoscopy versus Cologuard discussed. Discussed timing of repeat testing intervals if normal, as well as potential need for diagnostic Colonoscopy if positive Cologuard. Understanding expressed, and chose Colonoscopy.   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 Pap testing, followed by GYN, Dr. Juliene Pina   Immunization History  Administered Date(s) Administered   COVID-19, mRNA, vaccine(Comirnaty)12 years and older 01/17/2022   PFIZER(Purple Top)SARS-COV-2 Vaccination 12/03/2019   Pfizer Covid-19 Vaccine Bivalent Booster 31yrs & up 11/08/2020   Td 01/22/2007   Tdap 12/02/2018  Flu vaccine and updated COVID booster recommended when available. Hep C screening: agrees to test.  Urine microalbumin: today  No results found. Optho: wears glasses, visit in October last year.  Dental: last month.   Alcohol: 4 in past year.   Tobacco: none  Exercise:regular exercise - cardio and lifting most days per week - over per week.    History Patient Active Problem List   Diagnosis Date Noted   Newly diagnosed type 1 diabetes mellitus (HCC) 09/18/2021   Hypophosphatemia 09/18/2021    COVID-19 virus infection 09/18/2021   Diabetic ketoacidosis without coma associated with type 1 diabetes mellitus (HCC) 09/17/2021   Hypokalemia 09/17/2021   Genetic testing 05/31/2020   Family history of prostate cancer    Family history of gene mutation    Family history of breast cancer    GERD 01/20/2008   ECZEMA 01/20/2008   ELEVATED BLOOD PRESSURE WITHOUT DIAGNOSIS OF HYPERTENSION 01/20/2008   ACL TEAR 07/10/2006   Past Medical History:  Diagnosis Date   Family history of breast cancer    Family history of gene mutation    Family history of prostate cancer    Past Surgical History:  Procedure Laterality Date   BREAST CYST EXCISION Right    fatty tumor 2002   Allergies  Allergen Reactions   Tylenol [Acetaminophen] Palpitations   Metronidazole    Other    Prior to Admission medications   Medication Sig Start Date End Date Taking? Authorizing Provider  amoxicillin-clavulanate (AUGMENTIN) 875-125 MG tablet Take 1 tablet by mouth 2 (two) times daily. 03/11/22   Shade Flood, MD  Continuous Blood Gluc Transmit (DEXCOM G6 TRANSMITTER) MISC USE AS DIRECTED FOR CONTINUOUS GLUCOSE MONITORING, REUSE TRANSMITTER THEN REPLACE    [provider]  Glucagon (BAQSIMI ONE PACK) 3 MG/DOSE POWD Place 3 mg into the nose every 15 (fifteen) minutes. If needed for severe hypoglycemia. 10/28/21   Shade Flood, MD  Ibuprofen-Acetaminophen (ADVIL DUAL ACTION PO) Take 1 tablet by mouth every 6 (six) hours as needed (pain).    [provider]  insulin aspart (NOVOLOG) 100 UNIT/ML FlexPen Inject 4 units TID before meals plus sliding scale as below CBG < 70: Implement Hypoglycemia Standing Orders and refer to Hypoglycemia Standing Orders sidebar report CBG 70 - 120: 0 units CBG 121 - 150: 2 units CBG 151 - 200: 3 units CBG 201 - 250: 5 units CBG 251 - 300: 8 units CBG 301 - 350: 11 units CBG 351 - 400: 15 units CBG > 400: call MD and obtain STAT lab verification 09/18/21   Almon Hercules, MD  Insulin Disposable Pump (OMNIPOD 5 G6 POD, GEN 5,) MISC Use one pod every 72 hours hours for insulin delivery. 12/10/21 12/10/22  [provider]  Insulin Pen Needle (PEN NEEDLES 3/16") 31G X 5 MM MISC 1 Pen by Does not apply route 4 (four) times daily -  before meals and at bedtime. 12/10/21   Shade Flood, MD   Social History   Socioeconomic History   Marital status: Single    Spouse name: Not on file   Number of children: Not on file  Years of education: Not on file   Highest education level: Not on file  Occupational History   Not on file  Tobacco Use   Smoking status: Never   Smokeless tobacco: Never  Substance and Sexual Activity   Alcohol use: Yes    Alcohol/week: 2.0 standard drinks of alcohol    Types: 2 Glasses of wine per week    Comment: rare   Drug use: Never   Sexual activity: Yes  Other Topics Concern   Not on file  Social History Narrative   Not on file   Social Determinants of Health   Financial Resource Strain: Not on file  Food Insecurity: Not on file  Transportation Needs: Not on file  Physical Activity: Not on file  Stress: Not on file  Social Connections: Not on file  Intimate Partner Violence: Not on file    Review of Systems 13 point review of systems per patient health survey noted.  Negative other than as indicated above or in HPI.    Objective:   Vitals:   09/29/22 1610  BP: 118/68  Pulse: 85  Temp: 98.7 F (37.1 C)  TempSrc: Temporal  SpO2: 99%  Weight: 149 lb (67.6 kg)  Height: 5\' 2"  (1.575 m)     Physical Exam Vitals reviewed.  Constitutional:      Appearance: She is well-developed.  HENT:     Head: Normocephalic and atraumatic.     Right Ear: External ear normal.     Left Ear: External ear normal.  Eyes:     Conjunctiva/sclera: Conjunctivae normal.     Pupils: Pupils are equal, round, and reactive to light.  Neck:     Thyroid: No thyromegaly.  Cardiovascular:     Rate and Rhythm: Normal  rate and regular rhythm.     Heart sounds: Normal heart sounds. No murmur heard. Pulmonary:     Effort: Pulmonary effort is normal. No respiratory distress.     Breath sounds: Normal breath sounds. No wheezing.  Abdominal:     General: Bowel sounds are normal.     Palpations: Abdomen is soft.     Tenderness: There is no abdominal tenderness.  Musculoskeletal:        General: No tenderness. Normal range of motion.     Cervical back: Normal range of motion and neck supple.  Lymphadenopathy:     Cervical: No cervical adenopathy.  Skin:    General: Skin is warm and dry.     Findings: No rash.  Neurological:     Mental Status: She is alert and oriented to person, place, and time.  Psychiatric:        Behavior: Behavior normal.        Thought Content: Thought content normal.    Assessment & Plan:  Angelica Martinez is a 47 y.o. female . Annual physical exam - Plan: Comprehensive metabolic panel, CBC with Differential/Platelet, Lipid panel, Microalbumin / creatinine urine ratio, CANCELED: Hemoglobin A1c  - -anticipatory guidance as below in AVS, screening labs above. Health maintenance items as above in HPI discussed/recommended as applicable.   -Gastroenterology referral plan for colonoscopy, she will let me know which practice/provider she prefers.  -Family history of breast cancer, consider annual breast MRI with mammogram.  Recent mammogram noted.  Will discuss with her gynecologist but I am happy to order as well.  -Keep follow-up as planned with her endocrinologist tomorrow.  Screening labs today should be available at that time, A1c deferred to endocrinology.  Urine  microalbumin ordered.  Screening, anemia, deficiency, iron - Plan: CBC with Differential/Platelet  Screening for hyperlipidemia - Plan: Lipid panel  Need for hepatitis C screening test - Plan: Hepatitis C antibody    No orders of the defined types were placed in this encounter.  Patient Instructions  Thanks for  coming in today.  If any concerns on labs I will let you know. They may be available tomorrow to review at endocrinology.  I would consider a breast MRI but can be discussed with your gynecologist at upcoming appointment.  Covid booster with flu vaccine when available.   Let me know if there are questions.   Preventive Care 41-19 Years Old, Female Preventive care refers to lifestyle choices and visits with your health care provider that can promote health and wellness. Preventive care visits are also called wellness exams. What can I expect for my preventive care visit? Counseling Your health care provider may ask you questions about your: Medical history, including: Past medical problems. Family medical history. Pregnancy history. Current health, including: Menstrual cycle. Method of birth control. Emotional well-being. Home life and relationship well-being. Sexual activity and sexual health. Lifestyle, including: Alcohol, nicotine or tobacco, and drug use. Access to firearms. Diet, exercise, and sleep habits. Work and work Astronomer. Sunscreen use. Safety issues such as seatbelt and bike helmet use. Physical exam Your health care provider will check your: Height and weight. These may be used to calculate your BMI (body mass index). BMI is a measurement that tells if you are at a healthy weight. Waist circumference. This measures the distance around your waistline. This measurement also tells if you are at a healthy weight and may help predict your risk of certain diseases, such as type 2 diabetes and high blood pressure. Heart rate and blood pressure. Body temperature. Skin for abnormal spots. What immunizations do I need?  Vaccines are usually given at various ages, according to a schedule. Your health care provider will recommend vaccines for you based on your age, medical history, and lifestyle or other factors, such as travel or where you work. What tests do I  need? Screening Your health care provider may recommend screening tests for certain conditions. This may include: Lipid and cholesterol levels. Diabetes screening. This is done by checking your blood sugar (glucose) after you have not eaten for a while (fasting). Pelvic exam and Pap test. Hepatitis B test. Hepatitis C test. HIV (human immunodeficiency virus) test. STI (sexually transmitted infection) testing, if you are at risk. Lung cancer screening. Colorectal cancer screening. Mammogram. Talk with your health care provider about when you should start having regular mammograms. This may depend on whether you have a family history of breast cancer. BRCA-related cancer screening. This may be done if you have a family history of breast, ovarian, tubal, or peritoneal cancers. Bone density scan. This is done to screen for osteoporosis. Talk with your health care provider about your test results, treatment options, and if necessary, the need for more tests. Follow these instructions at home: Eating and drinking  Eat a diet that includes fresh fruits and vegetables, whole grains, lean protein, and low-fat dairy products. Take vitamin and mineral supplements as recommended by your health care provider. Do not drink alcohol if: Your health care provider tells you not to drink. You are pregnant, may be pregnant, or are planning to become pregnant. If you drink alcohol: Limit how much you have to 0-1 drink a day. Know how much alcohol is in your drink. In  the [place] one drink equals one 12 oz bottle of beer (355 mL), one 5 oz glass of wine (148 mL), or one 1 oz glass of hard liquor (44 mL). Lifestyle Brush your teeth every morning and night with fluoride toothpaste. Floss one time each day. Exercise for at least 30 minutes 5 or more days each week. Do not use any products that contain nicotine or tobacco. These products include cigarettes, chewing tobacco, and vaping devices, such as  e-cigarettes. If you need help quitting, ask your health care provider. Do not use drugs. If you are sexually active, practice safe sex. Use a condom or other form of protection to prevent STIs. If you do not wish to become pregnant, use a form of birth control. If you plan to become pregnant, see your health care provider for a prepregnancy visit. Take aspirin only as told by your health care provider. Make sure that you understand how much to take and what form to take. Work with your health care provider to find out whether it is safe and beneficial for you to take aspirin daily. Find healthy ways to manage stress, such as: Meditation, yoga, or listening to music. Journaling. Talking to a trusted person. Spending time with friends and family. Minimize exposure to UV radiation to reduce your risk of skin cancer. Safety Always wear your seat belt while driving or riding in a vehicle. Do not drive: If you have been drinking alcohol. Do not ride with someone who has been drinking. When you are tired or distracted. While texting. If you have been using any mind-altering substances or drugs. Wear a helmet and other protective equipment during sports activities. If you have firearms in your house, make sure you follow all gun safety procedures. Seek help if you have been physically or sexually abused. What's next? Visit your health care provider once a year for an annual wellness visit. Ask your health care provider how often you should have your eyes and teeth checked. Stay up to date on all vaccines. This information is not intended to replace advice given to you by your health care provider. Make sure you discuss any questions you have with your health care provider. Document Revised: 08/01/2020 Document Reviewed: 08/01/2020 Elsevier Patient Education  2024 [employer]vier [employer].      Signed,   Meredith Staggers, MD [facility], [facility] 09/29/22 5:18 PM

## 2022-10-07 ENCOUNTER — Other Ambulatory Visit: Payer: Self-pay | Admitting: Obstetrics & Gynecology

## 2022-10-07 DIAGNOSIS — Z803 Family history of malignant neoplasm of breast: Secondary | ICD-10-CM

## 2022-12-29 ENCOUNTER — Other Ambulatory Visit (HOSPITAL_BASED_OUTPATIENT_CLINIC_OR_DEPARTMENT_OTHER): Payer: Self-pay

## 2022-12-29 MED ORDER — COVID-19 MRNA VAC-TRIS(PFIZER) 30 MCG/0.3ML IM SUSY
0.3000 mL | PREFILLED_SYRINGE | Freq: Once | INTRAMUSCULAR | 0 refills | Status: AC
  Filled 2022-12-29: qty 0.3, 1d supply, fill #0

## 2023-04-21 ENCOUNTER — Encounter: Payer: Self-pay | Admitting: Family Medicine

## 2023-04-21 DIAGNOSIS — E162 Hypoglycemia, unspecified: Secondary | ICD-10-CM

## 2023-04-21 DIAGNOSIS — E10649 Type 1 diabetes mellitus with hypoglycemia without coma: Secondary | ICD-10-CM

## 2023-04-21 MED ORDER — ONDANSETRON HCL 4 MG PO TABS: 4.0000 mg | ORAL_TABLET | Freq: Three times a day (TID) | ORAL | 0 refills | Status: AC | PRN

## 2023-04-21 MED ORDER — BAQSIMI ONE PACK 3 MG/DOSE NA POWD
3.0000 mg | NASAL | 1 refills | Status: DC
Start: 2023-04-21 — End: 2023-11-03

## 2023-04-21 NOTE — Telephone Encounter (Signed)
 Zofran is not active in the chart please advise if this is an acceptable refill.   Requested Prescriptions   Pending Prescriptions Disp Refills   Glucagon (BAQSIMI ONE PACK) 3 MG/DOSE POWD 1 each 1    Sig: Place 3 mg into the nose every 15 (fifteen) minutes. If needed for severe hypoglycemia.     Date of patient request: 04/21/2023 Last office visit: 09/29/2022 Upcoming visit: Visit date not found Date of last refill: 10/28/2021 Last refill amount: 1 each

## 2023-11-03 ENCOUNTER — Encounter: Payer: Self-pay | Admitting: Family Medicine

## 2023-11-03 DIAGNOSIS — E162 Hypoglycemia, unspecified: Secondary | ICD-10-CM

## 2023-11-03 MED ORDER — BAQSIMI ONE PACK 3 MG/DOSE NA POWD
3.0000 mg | NASAL | 1 refills | Status: AC
Start: 2023-11-03 — End: ?

## 2023-11-03 NOTE — Telephone Encounter (Signed)
 I did order that Baqsimi  previously, and she is followed routinely by [facility] endocrinologist.  Will refill.

## 2023-12-28 ENCOUNTER — Other Ambulatory Visit (HOSPITAL_BASED_OUTPATIENT_CLINIC_OR_DEPARTMENT_OTHER): Payer: Self-pay

## 2023-12-28 MED ORDER — COMIRNATY 30 MCG/0.3ML IM SUSY
0.3000 mL | PREFILLED_SYRINGE | Freq: Once | INTRAMUSCULAR | 0 refills | Status: AC
  Filled 2023-12-28: qty 0.3, 1d supply, fill #0

## 2023-12-28 MED ORDER — FLUZONE 0.5 ML IM SUSY
0.5000 mL | PREFILLED_SYRINGE | Freq: Once | INTRAMUSCULAR | 0 refills | Status: AC
  Filled 2023-12-28: qty 0.5, 1d supply, fill #0
